# Patient Record
Sex: Female | Born: 1960 | Race: White | Hispanic: No | Marital: Married | Smoking: Never smoker
Health system: Southern US, Community
[De-identification: ages and names within clinical notes are randomized; demographics above are authoritative.]

## PROBLEM LIST (undated history)

## (undated) DIAGNOSIS — Z800 Family history of malignant neoplasm of digestive organs: Secondary | ICD-10-CM

## (undated) DIAGNOSIS — R112 Nausea with vomiting, unspecified: Secondary | ICD-10-CM

## (undated) DIAGNOSIS — J45909 Unspecified asthma, uncomplicated: Secondary | ICD-10-CM

## (undated) DIAGNOSIS — Z8041 Family history of malignant neoplasm of ovary: Secondary | ICD-10-CM

## (undated) DIAGNOSIS — Z803 Family history of malignant neoplasm of breast: Secondary | ICD-10-CM

## (undated) DIAGNOSIS — Z9889 Other specified postprocedural states: Secondary | ICD-10-CM

## (undated) DIAGNOSIS — T7840XA Allergy, unspecified, initial encounter: Secondary | ICD-10-CM

## (undated) HISTORY — DX: Unspecified asthma, uncomplicated: J45.909

## (undated) HISTORY — PX: AUGMENTATION MAMMAPLASTY: SUR837

## (undated) HISTORY — DX: Other specified postprocedural states: Z98.890

## (undated) HISTORY — PX: DILATION AND CURETTAGE OF UTERUS: SHX78

## (undated) HISTORY — DX: Family history of malignant neoplasm of breast: Z80.3

## (undated) HISTORY — DX: Family history of malignant neoplasm of digestive organs: Z80.0

## (undated) HISTORY — DX: Nausea with vomiting, unspecified: R11.2

## (undated) HISTORY — DX: Allergy, unspecified, initial encounter: T78.40XA

## (undated) HISTORY — DX: Family history of malignant neoplasm of ovary: Z80.41

---

## 1982-03-19 HISTORY — PX: TONSILECTOMY, ADENOIDECTOMY, BILATERAL MYRINGOTOMY AND TUBES: SHX2538

## 1996-03-19 HISTORY — PX: PLACEMENT OF BREAST IMPLANTS: SHX6334

## 1998-01-14 ENCOUNTER — Ambulatory Visit (HOSPITAL_COMMUNITY): Admission: RE | Admit: 1998-01-14 | Discharge: 1998-01-14 | Payer: Self-pay | Admitting: *Deleted

## 1998-07-26 ENCOUNTER — Ambulatory Visit (HOSPITAL_COMMUNITY): Admission: RE | Admit: 1998-07-26 | Discharge: 1998-07-26 | Payer: Self-pay | Admitting: *Deleted

## 1998-08-29 ENCOUNTER — Other Ambulatory Visit: Admission: RE | Admit: 1998-08-29 | Discharge: 1998-08-29 | Payer: Self-pay | Admitting: *Deleted

## 1999-03-20 HISTORY — PX: TUBAL LIGATION: SHX77

## 1999-07-07 ENCOUNTER — Inpatient Hospital Stay (HOSPITAL_COMMUNITY): Admission: AD | Admit: 1999-07-07 | Discharge: 1999-07-09 | Payer: Self-pay | Admitting: Obstetrics & Gynecology

## 1999-08-07 ENCOUNTER — Other Ambulatory Visit: Admission: RE | Admit: 1999-08-07 | Discharge: 1999-08-07 | Payer: Self-pay | Admitting: Obstetrics & Gynecology

## 1999-08-17 ENCOUNTER — Encounter: Admission: RE | Admit: 1999-08-17 | Discharge: 1999-11-15 | Payer: Self-pay | Admitting: Obstetrics & Gynecology

## 2000-08-27 ENCOUNTER — Other Ambulatory Visit: Admission: RE | Admit: 2000-08-27 | Discharge: 2000-08-27 | Payer: Self-pay | Admitting: *Deleted

## 2001-01-03 ENCOUNTER — Encounter (INDEPENDENT_AMBULATORY_CARE_PROVIDER_SITE_OTHER): Payer: Self-pay | Admitting: Specialist

## 2001-01-03 ENCOUNTER — Other Ambulatory Visit: Admission: RE | Admit: 2001-01-03 | Discharge: 2001-01-03 | Payer: Self-pay | Admitting: *Deleted

## 2002-05-19 ENCOUNTER — Other Ambulatory Visit: Admission: RE | Admit: 2002-05-19 | Discharge: 2002-05-19 | Payer: Self-pay | Admitting: *Deleted

## 2002-12-08 ENCOUNTER — Encounter: Payer: Self-pay | Admitting: *Deleted

## 2002-12-08 ENCOUNTER — Encounter: Admission: RE | Admit: 2002-12-08 | Discharge: 2002-12-08 | Payer: Self-pay | Admitting: *Deleted

## 2004-01-10 ENCOUNTER — Other Ambulatory Visit: Admission: RE | Admit: 2004-01-10 | Discharge: 2004-01-10 | Payer: Self-pay | Admitting: *Deleted

## 2004-03-10 ENCOUNTER — Ambulatory Visit (HOSPITAL_COMMUNITY): Admission: RE | Admit: 2004-03-10 | Discharge: 2004-03-10 | Payer: Self-pay | Admitting: *Deleted

## 2005-01-03 ENCOUNTER — Other Ambulatory Visit: Admission: RE | Admit: 2005-01-03 | Discharge: 2005-01-03 | Payer: Self-pay | Admitting: *Deleted

## 2005-03-16 ENCOUNTER — Ambulatory Visit (HOSPITAL_COMMUNITY): Admission: RE | Admit: 2005-03-16 | Discharge: 2005-03-16 | Payer: Self-pay | Admitting: *Deleted

## 2005-08-21 ENCOUNTER — Emergency Department (HOSPITAL_COMMUNITY): Admission: EM | Admit: 2005-08-21 | Discharge: 2005-08-21 | Payer: Self-pay | Admitting: Family Medicine

## 2006-01-02 ENCOUNTER — Other Ambulatory Visit: Admission: RE | Admit: 2006-01-02 | Discharge: 2006-01-02 | Payer: Self-pay | Admitting: *Deleted

## 2006-04-18 ENCOUNTER — Ambulatory Visit (HOSPITAL_COMMUNITY): Admission: RE | Admit: 2006-04-18 | Discharge: 2006-04-18 | Payer: Self-pay | Admitting: *Deleted

## 2007-04-22 ENCOUNTER — Ambulatory Visit (HOSPITAL_COMMUNITY): Admission: RE | Admit: 2007-04-22 | Discharge: 2007-04-22 | Payer: Self-pay | Admitting: *Deleted

## 2007-05-12 ENCOUNTER — Other Ambulatory Visit: Admission: RE | Admit: 2007-05-12 | Discharge: 2007-05-12 | Payer: Self-pay | Admitting: *Deleted

## 2007-08-27 ENCOUNTER — Ambulatory Visit (HOSPITAL_COMMUNITY): Admission: RE | Admit: 2007-08-27 | Discharge: 2007-08-27 | Payer: Self-pay | Admitting: Internal Medicine

## 2007-08-28 ENCOUNTER — Encounter (HOSPITAL_COMMUNITY): Admission: RE | Admit: 2007-08-28 | Discharge: 2007-08-29 | Payer: Self-pay | Admitting: "Endocrinology

## 2008-04-22 ENCOUNTER — Ambulatory Visit (HOSPITAL_COMMUNITY): Admission: RE | Admit: 2008-04-22 | Discharge: 2008-04-22 | Payer: Self-pay | Admitting: Gynecology

## 2009-04-29 ENCOUNTER — Ambulatory Visit (HOSPITAL_COMMUNITY): Admission: RE | Admit: 2009-04-29 | Discharge: 2009-04-29 | Payer: Self-pay | Admitting: Gynecology

## 2009-05-05 ENCOUNTER — Encounter: Admission: RE | Admit: 2009-05-05 | Discharge: 2009-05-05 | Payer: Self-pay | Admitting: Internal Medicine

## 2010-07-25 ENCOUNTER — Other Ambulatory Visit (HOSPITAL_COMMUNITY): Payer: Self-pay | Admitting: Gynecology

## 2010-07-25 DIAGNOSIS — Z1231 Encounter for screening mammogram for malignant neoplasm of breast: Secondary | ICD-10-CM

## 2010-08-09 ENCOUNTER — Ambulatory Visit (HOSPITAL_COMMUNITY)
Admission: RE | Admit: 2010-08-09 | Discharge: 2010-08-09 | Disposition: A | Discharge status: Home / Self Care | Payer: 59 | Source: Ambulatory Visit | Attending: Gynecology | Admitting: Gynecology

## 2010-08-09 DIAGNOSIS — Z1231 Encounter for screening mammogram for malignant neoplasm of breast: Secondary | ICD-10-CM | POA: Insufficient documentation

## 2011-07-27 ENCOUNTER — Other Ambulatory Visit: Payer: Self-pay | Admitting: Internal Medicine

## 2011-07-27 ENCOUNTER — Other Ambulatory Visit (HOSPITAL_COMMUNITY)
Admission: RE | Admit: 2011-07-27 | Discharge: 2011-07-27 | Disposition: A | Discharge status: Home / Self Care | Payer: 59 | Source: Ambulatory Visit | Attending: Internal Medicine | Admitting: Internal Medicine

## 2011-07-27 DIAGNOSIS — Z1159 Encounter for screening for other viral diseases: Secondary | ICD-10-CM | POA: Insufficient documentation

## 2011-07-27 DIAGNOSIS — Z01419 Encounter for gynecological examination (general) (routine) without abnormal findings: Secondary | ICD-10-CM | POA: Insufficient documentation

## 2012-01-07 ENCOUNTER — Other Ambulatory Visit (HOSPITAL_COMMUNITY): Payer: Self-pay | Admitting: Internal Medicine

## 2012-01-07 DIAGNOSIS — Z1231 Encounter for screening mammogram for malignant neoplasm of breast: Secondary | ICD-10-CM

## 2012-01-25 ENCOUNTER — Ambulatory Visit (HOSPITAL_COMMUNITY)
Admission: RE | Admit: 2012-01-25 | Discharge: 2012-01-25 | Disposition: A | Discharge status: Home / Self Care | Payer: 59 | Source: Ambulatory Visit | Attending: Internal Medicine | Admitting: Internal Medicine

## 2012-01-25 DIAGNOSIS — Z1231 Encounter for screening mammogram for malignant neoplasm of breast: Secondary | ICD-10-CM | POA: Insufficient documentation

## 2013-12-23 ENCOUNTER — Other Ambulatory Visit: Payer: Self-pay | Admitting: Gynecology

## 2014-01-18 ENCOUNTER — Other Ambulatory Visit: Payer: Self-pay | Admitting: Gynecology

## 2014-01-19 LAB — CYTOLOGY - PAP

## 2015-03-23 MED FILL — methIMAzole 5 MG TABS: 5 | 30 days supply | Qty: 60 | Fill #0 | Status: TO

## 2015-04-28 MED FILL — methIMAzole 5 MG TABS: 5 | 30 days supply | Qty: 60 | Fill #0

## 2015-06-01 MED FILL — methIMAzole 5 MG TABS: 5 | 30 days supply | Qty: 60 | Fill #1

## 2015-06-22 ENCOUNTER — Other Ambulatory Visit: Payer: Self-pay

## 2015-06-22 DIAGNOSIS — Z1231 Encounter for screening mammogram for malignant neoplasm of breast: Secondary | ICD-10-CM

## 2015-06-27 DIAGNOSIS — N941 Unspecified dyspareunia: Secondary | ICD-10-CM | POA: Diagnosis not present

## 2015-06-27 DIAGNOSIS — N94810 Vulvar vestibulitis: Secondary | ICD-10-CM | POA: Diagnosis not present

## 2015-06-27 MED FILL — BETAMETHASONE VA 0.1% CREAM: 0.1 | 30 days supply | Qty: 45 | Fill #0

## 2015-07-06 MED FILL — methIMAzole 5 MG TABS: 5 | 30 days supply | Qty: 60 | Fill #2

## 2015-07-11 ENCOUNTER — Ambulatory Visit: Payer: 59

## 2015-07-12 ENCOUNTER — Ambulatory Visit: Payer: 59

## 2015-07-22 MED FILL — DOXYCYCLINE HYCLATE 100 MG: 100 | 3 days supply | Qty: 6 | Fill #0

## 2015-07-25 DIAGNOSIS — E0590 Thyrotoxicosis, unspecified without thyrotoxic crisis or storm: Secondary | ICD-10-CM | POA: Diagnosis not present

## 2015-08-05 MED FILL — methIMAzole 5 MG TABS: 5 | 30 days supply | Qty: 60 | Fill #3

## 2015-08-08 DIAGNOSIS — H524 Presbyopia: Secondary | ICD-10-CM | POA: Diagnosis not present

## 2015-08-08 DIAGNOSIS — H5213 Myopia, bilateral: Secondary | ICD-10-CM | POA: Diagnosis not present

## 2015-08-08 DIAGNOSIS — H52221 Regular astigmatism, right eye: Secondary | ICD-10-CM | POA: Diagnosis not present

## 2015-08-25 DIAGNOSIS — Z1231 Encounter for screening mammogram for malignant neoplasm of breast: Secondary | ICD-10-CM | POA: Diagnosis not present

## 2015-08-25 DIAGNOSIS — Z6822 Body mass index (BMI) 22.0-22.9, adult: Secondary | ICD-10-CM | POA: Diagnosis not present

## 2015-08-25 DIAGNOSIS — Z01419 Encounter for gynecological examination (general) (routine) without abnormal findings: Secondary | ICD-10-CM | POA: Diagnosis not present

## 2015-08-25 MED FILL — PROGESTERONE 100 MG CAPSULE: 100 | 30 days supply | Qty: 30 | Fill #0

## 2015-09-14 MED FILL — MINIVELLE 0.05 MG PATCH: 0.05 | 28 days supply | Qty: 8 | Fill #0

## 2015-09-22 MED FILL — PROGESTERONE 100 MG CAPSULE: 100 | 30 days supply | Qty: 30 | Fill #1

## 2015-09-22 MED FILL — methIMAzole 5 MG TABS: 5 | 90 days supply | Qty: 90 | Fill #0

## 2015-10-12 MED FILL — MINIVELLE 0.05 MG PATCH: 0.05 | 28 days supply | Qty: 8 | Fill #1

## 2015-10-19 MED FILL — PROGESTERONE 100 MG CAPSULE: 100 | 30 days supply | Qty: 30 | Fill #2

## 2015-10-31 DIAGNOSIS — Z13820 Encounter for screening for osteoporosis: Secondary | ICD-10-CM | POA: Diagnosis not present

## 2015-10-31 DIAGNOSIS — Z803 Family history of malignant neoplasm of breast: Secondary | ICD-10-CM | POA: Diagnosis not present

## 2015-10-31 DIAGNOSIS — Z8041 Family history of malignant neoplasm of ovary: Secondary | ICD-10-CM | POA: Diagnosis not present

## 2015-11-04 DIAGNOSIS — E0590 Thyrotoxicosis, unspecified without thyrotoxic crisis or storm: Secondary | ICD-10-CM | POA: Diagnosis not present

## 2015-11-10 DIAGNOSIS — E0590 Thyrotoxicosis, unspecified without thyrotoxic crisis or storm: Secondary | ICD-10-CM | POA: Diagnosis not present

## 2015-11-10 DIAGNOSIS — E012 Iodine-deficiency related (endemic) goiter, unspecified: Secondary | ICD-10-CM | POA: Diagnosis not present

## 2015-11-10 MED FILL — MINIVELLE 0.05 MG PATCH: 0.05 | 28 days supply | Qty: 8 | Fill #2

## 2015-11-14 ENCOUNTER — Other Ambulatory Visit (HOSPITAL_COMMUNITY): Payer: Self-pay | Admitting: Endocrinology

## 2015-11-14 DIAGNOSIS — E049 Nontoxic goiter, unspecified: Secondary | ICD-10-CM

## 2015-11-18 ENCOUNTER — Ambulatory Visit: Payer: 59 | Attending: Gynecologic Oncology | Admitting: Gynecologic Oncology

## 2015-11-18 ENCOUNTER — Encounter: Payer: Self-pay | Admitting: Gynecologic Oncology

## 2015-11-18 ENCOUNTER — Other Ambulatory Visit (HOSPITAL_BASED_OUTPATIENT_CLINIC_OR_DEPARTMENT_OTHER): Payer: 59

## 2015-11-18 VITALS — BP 122/59 | HR 66 | Temp 97.8°F | Resp 18 | Ht 67.5 in | Wt 152.3 lb

## 2015-11-18 DIAGNOSIS — N83209 Unspecified ovarian cyst, unspecified side: Secondary | ICD-10-CM | POA: Insufficient documentation

## 2015-11-18 DIAGNOSIS — Z9851 Tubal ligation status: Secondary | ICD-10-CM | POA: Insufficient documentation

## 2015-11-18 DIAGNOSIS — Z8041 Family history of malignant neoplasm of ovary: Secondary | ICD-10-CM

## 2015-11-18 DIAGNOSIS — Z803 Family history of malignant neoplasm of breast: Secondary | ICD-10-CM | POA: Diagnosis not present

## 2015-11-18 DIAGNOSIS — Z808 Family history of malignant neoplasm of other organs or systems: Secondary | ICD-10-CM | POA: Diagnosis not present

## 2015-11-18 DIAGNOSIS — R971 Elevated cancer antigen 125 [CA 125]: Secondary | ICD-10-CM

## 2015-11-18 DIAGNOSIS — Z8249 Family history of ischemic heart disease and other diseases of the circulatory system: Secondary | ICD-10-CM | POA: Diagnosis not present

## 2015-11-18 DIAGNOSIS — E0500 Thyrotoxicosis with diffuse goiter without thyrotoxic crisis or storm: Secondary | ICD-10-CM | POA: Diagnosis not present

## 2015-11-18 DIAGNOSIS — Z833 Family history of diabetes mellitus: Secondary | ICD-10-CM | POA: Diagnosis not present

## 2015-11-18 DIAGNOSIS — N83202 Unspecified ovarian cyst, left side: Secondary | ICD-10-CM

## 2015-11-18 DIAGNOSIS — Z809 Family history of malignant neoplasm, unspecified: Secondary | ICD-10-CM | POA: Insufficient documentation

## 2015-11-18 LAB — CEA (IN HOUSE-CHCC): CEA (CHCC-In House): <1 ng/mL (ref 0.00–5.00)

## 2015-11-18 NOTE — Patient Instructions (Signed)
We will contact you with your results from the CA 125 Level and the CEA level and Dr Terrence Dupont Rossi's recommendations . We will also let you know if genetic counseling is recommended.  Thank you

## 2015-11-18 NOTE — Progress Notes (Signed)
Consult Note: Gyn-Onc  Consult was requested by Dr. Radene Knee for the evaluation of Sierra Grant 55 y.o. female  CC:  Chief Complaint  Patient presents with  . Elevated CA 125, Family history of ovarian ca    New patient    Assessment/Plan:  Sierra Grant  is a 55 y.o.  year old with a family history of ovarian, pancreatic and breast cancer in the setting of negative BRCA testing, and now with CA 125 without concerning US findings.  The patient is reluctant to undergo surgical procedures unless strongly felt to be medically necessary. She understands that in addition to the increased risk for surgical complications, a BSO also exposes women to increased all-cause mortality from surgical castration prior to the age of 55.   I explained that in her particular presentation, I do not feel strongly at this time that a BSO is strongly indicated. I explained that CA 125 is a non-specific marker.   We will start by repeating her CA 125. If it shows a consistent trend upwards or stable in the 40's, I recommend a CT abdomen and pelvis to evaluate for occult upper abdominal processes contributing to this elevation and would feel more compelled to recommend BSO or diagnostic laparoscopy. If her CA 125 has dropped, it can be assumed to have been a false positive/transient non-specific elevation, and no intervention is necessary.  In the meantime, we will have her seen by our genetic counselors as her mother's BRCA testing was performed in 2009, and her family history is striking for a familial predisposition. Therefore, more modern panels may expose an underlying genetic predisposition to breast/ovarian cancer, and may indicate proceeding with risk reducing surgery.   HPI: Sierra Grant is a 55 year old G1P1 who is seen in consult at the request of Dr Radene Knee for an elevation in [place] 125 in the setting of a strong family history for ovarian cancer. The patient is a fairly healthy woman. Her mother was  diagnosed with stage IIIc ovarian cancer at age 6 and was treated with surgery and chemotherapy ultimately succumbing to her disease 15 years after diagnosis. Her mother underwent BRCA germline testing in 2009 which was negative. However she has a very strong family history concerning for a potential hereditary cancer syndrome. The patient's maternal aunt has a history of breast cancer her maternal grandmother has a history of breast cancer her maternal grandfather has history pancreatic cancer in her maternal great uncle also has a history of pancreatic cancer and melanoma.   Premenopausally the patient's gynecologic history significant for infertility for which she received multiple laparoscopies however she did not receive ovarian stimulation. She ultimately conceive spontaneously 30 full-term pregnancy and delivery, however she did have multiple other spontaneous conception's and miscarriages. She has a history of Graves' disease.  She was followed with ultrasounds premenopausally, largely due to abnormal uterine bleeding. CA-125's are also drawn at that time due to her mother's history and these were per patient normal in the 20s. She is not had CA 125 her ultrasound for several years. She saw Dr. Radene Knee on 10/31/2015 and a CA-125 was drawn that was slightly elevated at 44 units per milliliter. A transvaginal ultrasound scan was performed that same day which revealed a grossly normal appearing uterus with dimensions of 7.7 x 3.3 x 4.8 cm with a 1.7 x 1.4 cm subserosal fibroid, a normal right ovary, and the left ovary with a simple appearing 1.6 x 1.2 cm cyst. There was no  free fluid seen.  Current Meds:  Outpatient Encounter Prescriptions as of 11/18/2015  Medication Sig  . cholecalciferol (VITAMIN D) 400 units TABS tablet Take 2,000 Units by mouth.  . loratadine (CLARITIN) 10 MG tablet Take 10 mg by mouth daily as needed for allergies.  . methimazole (TAPAZOLE) 5 MG tablet Take 5 mg by mouth daily.  Takes Mon - Fri  . MINIVELLE 0.05 MG/24HR patch   . progesterone (PROMETRIUM) 100 MG capsule 2 (two) times a week.   Marland Kitchen albuterol (PROVENTIL HFA;VENTOLIN HFA) 108 (90 Base) MCG/ACT inhaler Inhale 1-2 puffs into the lungs every 6 (six) hours as needed for wheezing or shortness of breath.   No facility-administered encounter medications on file as of 11/18/2015.     Allergy: Not on File  Social Hx:   Social History   Social History  . Marital status: Married    Spouse name: N/A  . Number of children: N/A  . Years of education: N/A   Occupational History  . Not on file.   Social History Main Topics  . Smoking status: Never Smoker  . Smokeless tobacco: Never Used  . Alcohol use No  . Drug use: No  . Sexual activity: Not on file   Other Topics Concern  . Not on file   Social History Narrative  . No narrative on file    Past Surgical Hx:  Past Surgical History:  Procedure Laterality Date  . [place] , 2002  . PLACEMENT OF BREAST IMPLANTS  1998  . TONSILECTOMY, ADENOIDECTOMY, BILATERAL MYRINGOTOMY AND TUBES  1984  . TUBAL LIGATION  2001    Past Medical Hx:  Past Medical History:  Diagnosis Date  . Allergy     Past Gynecological History:  infertility No LMP recorded.  Family Hx:  Family History  Problem Relation Age of Onset  . Cancer Mother   . Hypertension Mother   . Cancer Father   . Diabetes Father   . Hypertension Father   . Cancer Maternal Grandmother   . Cancer Maternal Grandfather   . Diabetes Maternal Grandfather   . Diabetes Paternal Grandmother   . Diabetes Paternal Grandfather     Review of Systems:  Constitutional  Feels well,    ENT Normal appearing ears and nares bilaterally Skin/Breast  No rash, sores, jaundice, itching, dryness Cardiovascular  No chest pain, shortness of breath, or edema  Pulmonary  No cough or wheeze.  Gastro Intestinal  No nausea, vomitting, or diarrhoea. No bright red blood per  rectum, no abdominal pain, change in bowel movement, or constipation.  Genito Urinary  No frequency, urgency, dysuria, no abnormal paps Musculo Skeletal  No myalgia, arthralgia, joint swelling or pain  Neurologic  No weakness, numbness, change in gait,  Psychology  No depression, anxiety, insomnia.   Vitals:  Blood pressure (!) 122/59, pulse 66, temperature 97.8 F (36.6 C), temperature source Oral, resp. rate 18, height 5' 7.5" (1.715 m), weight 152 lb 4.8 oz (69.1 kg), SpO2 100 %.  Physical Exam: Deferred  45 minutes of face to face counseling was performed with the patient.   Donaciano Eva, MD  11/18/2015, 5:42 PM

## 2015-11-19 LAB — CA 125: Cancer Antigen (CA) 125: 45 U/mL — ABNORMAL HIGH (ref 0.0–38.1)

## 2015-11-23 ENCOUNTER — Telehealth: Payer: Self-pay

## 2015-11-23 MED FILL — PROGESTERONE 100 MG CAPSULE: 100 | 90 days supply | Qty: 90 | Fill #3

## 2015-11-23 NOTE — Telephone Encounter (Signed)
Orders received from [facility] to contact the patient to update with CEA Level is "normal" and her CA 125 level is slightly elevated. Dr Terrence Dupont Rossi's recommendations are to proceed with CT sacn of abdomen and pelvis with oral and IV contrast. Also the genetic counselors reviewed her mothers testing and it recommended to proceed with testing since there are updated panels. Also patient needs to be scheduled for the CT Scan. Attempted to contact the patient , no answer , left a detailed message with call back requested . Out contact information was given. Melissa Cross, APNP aware.

## 2015-11-24 ENCOUNTER — Other Ambulatory Visit: Payer: Self-pay | Admitting: Gynecologic Oncology

## 2015-11-24 DIAGNOSIS — R971 Elevated cancer antigen 125 [CA 125]: Secondary | ICD-10-CM

## 2015-11-24 NOTE — Telephone Encounter (Signed)
Patient returned call , patient to schedule Ct due to her job hours and responsibilities .Patient not sure if she wants the genetic testing as of yet , will call back after see contacts her insurance. Melissa Cross , APNP aware.

## 2015-11-29 DIAGNOSIS — E0590 Thyrotoxicosis, unspecified without thyrotoxic crisis or storm: Secondary | ICD-10-CM | POA: Diagnosis not present

## 2015-12-09 MED FILL — MINIVELLE 0.05 MG PATCH: 0.05 | 28 days supply | Qty: 8 | Fill #3

## 2015-12-16 ENCOUNTER — Ambulatory Visit: Payer: 59 | Admitting: Gynecology

## 2015-12-22 ENCOUNTER — Encounter (HOSPITAL_COMMUNITY): Payer: Self-pay | Admitting: Radiology

## 2015-12-22 ENCOUNTER — Ambulatory Visit (HOSPITAL_COMMUNITY)
Admission: RE | Admit: 2015-12-22 | Discharge: 2015-12-22 | Disposition: A | Discharge status: Home / Self Care | Payer: 59 | Source: Ambulatory Visit | Attending: Gynecologic Oncology | Admitting: Gynecologic Oncology

## 2015-12-22 DIAGNOSIS — Z803 Family history of malignant neoplasm of breast: Secondary | ICD-10-CM | POA: Diagnosis not present

## 2015-12-22 DIAGNOSIS — Z8041 Family history of malignant neoplasm of ovary: Secondary | ICD-10-CM | POA: Insufficient documentation

## 2015-12-22 DIAGNOSIS — Z808 Family history of malignant neoplasm of other organs or systems: Secondary | ICD-10-CM | POA: Insufficient documentation

## 2015-12-22 DIAGNOSIS — R971 Elevated cancer antigen 125 [CA 125]: Secondary | ICD-10-CM | POA: Diagnosis not present

## 2015-12-22 MED ORDER — IOPAMIDOL (ISOVUE-300) INJECTION 61%
100.0000 mL | Freq: Once | INTRAVENOUS | Status: AC | PRN
  Administered 2015-12-22: 100 mL via INTRAVENOUS

## 2015-12-23 ENCOUNTER — Telehealth: Payer: Self-pay

## 2015-12-23 NOTE — Telephone Encounter (Signed)
Orders received from [facility] to contact the patient with her Ct of Abdomen and Pelvis : "no abnormal findings on CT scan, Ovaries are unremarkable". Dr Everitt Amber is recommending Genetic testing ,if genetics show risk ,then surgery is recommended". Attempted to contact the patient , no answer , left a detailed massage with call back requested to ensure the message was received as well as understood. Our office number was provided.

## 2016-01-06 ENCOUNTER — Telehealth: Payer: Self-pay

## 2016-01-06 NOTE — Telephone Encounter (Signed)
Orders received from [facility] to contact the patient to follow up on if the patient would like to follow through with Genetic Testing. The patient was contacted and "declined" Genetic Testing ,due to no medical coverage ,unless it is "medically necessary". Melissa Cross, APNP updated with the patient's decline in genetic testing.

## 2016-01-10 DIAGNOSIS — E0590 Thyrotoxicosis, unspecified without thyrotoxic crisis or storm: Secondary | ICD-10-CM | POA: Diagnosis not present

## 2016-01-10 DIAGNOSIS — E559 Vitamin D deficiency, unspecified: Secondary | ICD-10-CM | POA: Diagnosis not present

## 2016-01-10 DIAGNOSIS — J309 Allergic rhinitis, unspecified: Secondary | ICD-10-CM | POA: Diagnosis not present

## 2016-01-10 DIAGNOSIS — R0989 Other specified symptoms and signs involving the circulatory and respiratory systems: Secondary | ICD-10-CM | POA: Diagnosis not present

## 2016-01-10 DIAGNOSIS — Z0000 Encounter for general adult medical examination without abnormal findings: Secondary | ICD-10-CM | POA: Diagnosis not present

## 2016-01-10 DIAGNOSIS — R002 Palpitations: Secondary | ICD-10-CM | POA: Diagnosis not present

## 2016-01-10 DIAGNOSIS — Z1211 Encounter for screening for malignant neoplasm of colon: Secondary | ICD-10-CM | POA: Diagnosis not present

## 2016-01-10 DIAGNOSIS — Z1231 Encounter for screening mammogram for malignant neoplasm of breast: Secondary | ICD-10-CM | POA: Diagnosis not present

## 2016-01-10 MED FILL — MINIVELLE 0.05 MG PATCH: 0.05 | 28 days supply | Qty: 8 | Fill #4

## 2016-01-11 MED FILL — methIMAzole 5 MG TABS: 5 | 90 days supply | Qty: 90 | Fill #0

## 2016-01-30 DIAGNOSIS — Z0000 Encounter for general adult medical examination without abnormal findings: Secondary | ICD-10-CM | POA: Diagnosis not present

## 2016-02-01 ENCOUNTER — Ambulatory Visit (HOSPITAL_COMMUNITY): Payer: 59

## 2016-02-06 ENCOUNTER — Ambulatory Visit (HOSPITAL_COMMUNITY)
Admission: RE | Admit: 2016-02-06 | Discharge: 2016-02-06 | Disposition: A | Discharge status: Home / Self Care | Payer: 59 | Source: Ambulatory Visit | Attending: Endocrinology | Admitting: Endocrinology

## 2016-02-06 DIAGNOSIS — E049 Nontoxic goiter, unspecified: Secondary | ICD-10-CM

## 2016-02-15 MED FILL — MINIVELLE 0.05 MG PATCH: 0.05 | 28 days supply | Qty: 8 | Fill #5

## 2016-02-15 MED FILL — PROGESTERONE 100 MG CAPSULE: 100 | 90 days supply | Qty: 90 | Fill #4

## 2016-04-10 MED FILL — ESTRADIOL 0.05 MG PATCH: 0.05 | 84 days supply | Qty: 24 | Fill #0

## 2016-04-23 MED FILL — methIMAzole 5 MG TABS: 5 | 90 days supply | Qty: 90 | Fill #1

## 2016-04-25 MED FILL — OSELTAMIVIR PHOS 75 MG CAP: 75 | 10 days supply | Qty: 10 | Fill #0

## 2016-05-14 DIAGNOSIS — E0590 Thyrotoxicosis, unspecified without thyrotoxic crisis or storm: Secondary | ICD-10-CM | POA: Diagnosis not present

## 2016-05-24 MED FILL — PROGESTERONE 100 MG CAPSULE: 100 | 90 days supply | Qty: 90 | Fill #5

## 2016-06-29 MED FILL — ESTRADIOL 0.05 MG PATCH: 0.05 | 84 days supply | Qty: 24 | Fill #1

## 2016-07-09 MED FILL — methIMAzole 5 MG TABS: 5 | 30 days supply | Qty: 30 | Fill #2

## 2016-07-25 MED FILL — methIMAzole 10 MG TABS: 10 | 30 days supply | Qty: 30 | Fill #0

## 2016-08-23 MED FILL — PROGESTERONE 100 MG CAPSULE: 100 | 90 days supply | Qty: 90 | Fill #0

## 2016-08-23 MED FILL — methIMAzole 10 MG TABS: 10 | 30 days supply | Qty: 30 | Fill #1

## 2016-10-11 MED FILL — ESTRADIOL 0.05 MG PATCH: 0.05 | 84 days supply | Qty: 24 | Fill #2

## 2016-10-17 MED FILL — methIMAzole 10 MG TABS: 10 | 30 days supply | Qty: 30 | Fill #2

## 2016-11-06 DIAGNOSIS — E0590 Thyrotoxicosis, unspecified without thyrotoxic crisis or storm: Secondary | ICD-10-CM | POA: Diagnosis not present

## 2016-11-13 DIAGNOSIS — E0590 Thyrotoxicosis, unspecified without thyrotoxic crisis or storm: Secondary | ICD-10-CM | POA: Diagnosis not present

## 2016-11-13 DIAGNOSIS — E012 Iodine-deficiency related (endemic) goiter, unspecified: Secondary | ICD-10-CM | POA: Diagnosis not present

## 2016-11-22 MED FILL — methIMAzole 10 MG TABS: 10 | 90 days supply | Qty: 78 | Fill #0

## 2016-11-29 MED FILL — PROGESTERONE 100 MG CAPSULE: 100 | 90 days supply | Qty: 90 | Fill #0

## 2017-01-16 DIAGNOSIS — Z803 Family history of malignant neoplasm of breast: Secondary | ICD-10-CM | POA: Diagnosis not present

## 2017-01-16 DIAGNOSIS — Z6822 Body mass index (BMI) 22.0-22.9, adult: Secondary | ICD-10-CM | POA: Diagnosis not present

## 2017-01-16 DIAGNOSIS — Z8041 Family history of malignant neoplasm of ovary: Secondary | ICD-10-CM | POA: Diagnosis not present

## 2017-01-16 DIAGNOSIS — Z1231 Encounter for screening mammogram for malignant neoplasm of breast: Secondary | ICD-10-CM | POA: Diagnosis not present

## 2017-01-16 DIAGNOSIS — Z808 Family history of malignant neoplasm of other organs or systems: Secondary | ICD-10-CM | POA: Diagnosis not present

## 2017-01-16 DIAGNOSIS — Z01419 Encounter for gynecological examination (general) (routine) without abnormal findings: Secondary | ICD-10-CM | POA: Diagnosis not present

## 2017-01-17 ENCOUNTER — Encounter: Payer: Self-pay | Admitting: Gastroenterology

## 2017-01-30 MED FILL — ESTRADIOL 0.05 MG PATCH: 0.05 | 84 days supply | Qty: 24 | Fill #0

## 2017-02-05 DIAGNOSIS — R971 Elevated cancer antigen 125 [CA 125]: Secondary | ICD-10-CM | POA: Diagnosis not present

## 2017-02-05 DIAGNOSIS — Z803 Family history of malignant neoplasm of breast: Secondary | ICD-10-CM | POA: Diagnosis not present

## 2017-02-05 DIAGNOSIS — Z8041 Family history of malignant neoplasm of ovary: Secondary | ICD-10-CM | POA: Diagnosis not present

## 2017-02-06 ENCOUNTER — Other Ambulatory Visit (HOSPITAL_COMMUNITY): Payer: Self-pay | Admitting: Obstetrics and Gynecology

## 2017-02-06 DIAGNOSIS — Z803 Family history of malignant neoplasm of breast: Secondary | ICD-10-CM

## 2017-02-12 DIAGNOSIS — H52221 Regular astigmatism, right eye: Secondary | ICD-10-CM | POA: Diagnosis not present

## 2017-02-12 DIAGNOSIS — H524 Presbyopia: Secondary | ICD-10-CM | POA: Diagnosis not present

## 2017-02-12 DIAGNOSIS — H5213 Myopia, bilateral: Secondary | ICD-10-CM | POA: Diagnosis not present

## 2017-02-25 ENCOUNTER — Ambulatory Visit (HOSPITAL_COMMUNITY): Admission: RE | Admit: 2017-02-25 | Payer: 59 | Source: Ambulatory Visit

## 2017-03-01 MED FILL — PROGESTERONE 100 MG CAPSULE: 100 | 90 days supply | Qty: 90 | Fill #0

## 2017-03-04 ENCOUNTER — Ambulatory Visit (HOSPITAL_COMMUNITY)
Admission: RE | Admit: 2017-03-04 | Discharge: 2017-03-04 | Disposition: A | Discharge status: Home / Self Care | Payer: 59 | Source: Ambulatory Visit | Attending: Obstetrics and Gynecology | Admitting: Obstetrics and Gynecology

## 2017-03-04 DIAGNOSIS — Z803 Family history of malignant neoplasm of breast: Secondary | ICD-10-CM | POA: Insufficient documentation

## 2017-03-04 DIAGNOSIS — N6489 Other specified disorders of breast: Secondary | ICD-10-CM | POA: Diagnosis not present

## 2017-03-04 MED ORDER — GADOBENATE DIMEGLUMINE 529 MG/ML IV SOLN
15.0000 mL | Freq: Once | INTRAVENOUS | Status: AC | PRN
  Administered 2017-03-04: 15 mL via INTRAVENOUS

## 2017-03-05 MED FILL — methIMAzole 10 MG TABS: 10 | 90 days supply | Qty: 78 | Fill #1

## 2017-03-07 ENCOUNTER — Ambulatory Visit (AMBULATORY_SURGERY_CENTER): Payer: Self-pay

## 2017-03-07 VITALS — Ht 69.0 in | Wt 156.8 lb

## 2017-03-07 DIAGNOSIS — Z1211 Encounter for screening for malignant neoplasm of colon: Secondary | ICD-10-CM

## 2017-03-07 MED ORDER — NA SULFATE-K SULFATE-MG SULF 17.5-3.13-1.6 GM/177ML PO SOLN: 1.0000 | Freq: Once | ORAL | 0 refills | Status: AC

## 2017-03-07 MED FILL — SUPREP BOWEL PREP KIT: 17.5-3.13-1 | 1 days supply | Qty: 354 | Fill #0

## 2017-03-07 NOTE — Progress Notes (Signed)
Per pt, no allergies to soy or egg products.Pt not taking any weight loss meds or using  O2 at home.   Emmi video sent to email.

## 2017-03-21 ENCOUNTER — Ambulatory Visit (AMBULATORY_SURGERY_CENTER): Payer: 59 | Admitting: Gastroenterology

## 2017-03-21 ENCOUNTER — Encounter: Payer: Self-pay | Admitting: Gastroenterology

## 2017-03-21 ENCOUNTER — Telehealth: Payer: Self-pay

## 2017-03-21 ENCOUNTER — Other Ambulatory Visit: Payer: Self-pay

## 2017-03-21 VITALS — BP 107/66 | HR 75 | Temp 98.4°F | Resp 16 | Ht 69.0 in | Wt 156.0 lb

## 2017-03-21 DIAGNOSIS — Z800 Family history of malignant neoplasm of digestive organs: Secondary | ICD-10-CM | POA: Diagnosis present

## 2017-03-21 DIAGNOSIS — Z1211 Encounter for screening for malignant neoplasm of colon: Secondary | ICD-10-CM | POA: Diagnosis not present

## 2017-03-21 DIAGNOSIS — Z1212 Encounter for screening for malignant neoplasm of rectum: Secondary | ICD-10-CM | POA: Diagnosis not present

## 2017-03-21 MED ORDER — SODIUM CHLORIDE 0.9 % IV SOLN: 500.0000 mL | INTRAVENOUS | Status: DC

## 2017-03-21 NOTE — Telephone Encounter (Signed)
-----  Message from Ladene Artist, MD sent at 03/21/2017  2:44 PM EST ----- Please call pt. She had colonoscopy today and asked me to review her family history of cancer in more detail especially related to pancreatic cancer. She is a [employer] loss adjuster. I spoke with her and her husband for a while following her colonoscopy today.    Family history in [place] shows 2 cousins with colon cancer which I was not aware of earlier today. Colonoscopy was scheduled as routine screening, not [facility]. I have just changed her colonoscopy report to reflect this history, indication and changed her recall colonoscopy to 5 years instead of 10 as I told her earlier today.  Family history in [place] shows MGF, M uncle and P aunt with pancreatic cancer. From my reading on this topic today screening for pancreatic cancer should be considered for people who are surgical candidates with 2 or more blood relatives with pancreatic cancer with at least 1 of those being a first degree relative. In addition certain genetic markers such as BRCA2, PALB2, Lynch syndrome. Please have her confirm the results of her genetic testing. She told me it was negative so she does not current fit the guideline however if her family history hanges please notify us.

## 2017-03-21 NOTE — Patient Instructions (Signed)
**  Handouts given on hemorrhoids**   YOU HAD AN ENDOSCOPIC PROCEDURE TODAY AT [facility]:   Refer to the procedure report that was given to you for any specific questions about what was found during the examination.  If the procedure report does not answer your questions, please call your gastroenterologist to clarify.  If you requested that your care partner not be given the details of your procedure findings, then the procedure report has been included in a sealed envelope for you to review at your convenience later.  YOU SHOULD EXPECT: Some feelings of bloating in the abdomen. Passage of more gas than usual.  Walking can help get rid of the air that was put into your GI tract during the procedure and reduce the bloating. If you had a lower endoscopy (such as a colonoscopy or flexible sigmoidoscopy) you may notice spotting of blood in your stool or on the toilet paper. If you underwent a bowel prep for your procedure, you may not have a normal bowel movement for a few days.  Please Note:  You might notice some irritation and congestion in your nose or some drainage.  This is from the oxygen used during your procedure.  There is no need for concern and it should clear up in a day or so.  SYMPTOMS TO REPORT IMMEDIATELY:   Following lower endoscopy (colonoscopy or flexible sigmoidoscopy):  Excessive amounts of blood in the stool  Significant tenderness or worsening of abdominal pains  Swelling of the abdomen that is new, acute  Fever of 100F or higher   For urgent or emergent issues, a gastroenterologist can be reached at any hour by calling [phone].   DIET:  We do recommend a small meal at first, but then you may proceed to your regular diet.  Drink plenty of fluids but you should avoid alcoholic beverages for 24 hours.  ACTIVITY:  You should plan to take it easy for the rest of today and you should NOT DRIVE or use heavy machinery until tomorrow (because of the  sedation medicines used during the test).    FOLLOW UP: Our staff will call the number listed on your records the next business day following your procedure to check on you and address any questions or concerns that you may have regarding the information given to you following your procedure. If we do not reach you, we will leave a message.  However, if you are feeling well and you are not experiencing any problems, there is no need to return our call.  We will assume that you have returned to your regular daily activities without incident.  If any biopsies were taken you will be contacted by phone or by letter within the next 1-3 weeks.  Please call us at [phone] if you have not heard about the biopsies in 3 weeks.    SIGNATURES/CONFIDENTIALITY: You and/or your care partner have signed paperwork which will be entered into your electronic medical record.  These signatures attest to the fact that that the information above on your After Visit Summary has been reviewed and is understood.  Full responsibility of the confidentiality of this discharge information lies with you and/or your care-partner.

## 2017-03-21 NOTE — Telephone Encounter (Signed)
All of the information relayed to the patient.  All questions answered.  She will call back for any additional questions or concerns

## 2017-03-21 NOTE — Progress Notes (Signed)
Report to PACU, RN, vss, BBS= Clear.  

## 2017-03-21 NOTE — Progress Notes (Signed)
Pt's states no medical or surgical changes since previsit or office visit. 

## 2017-03-21 NOTE — Op Note (Addendum)
[facility] Patient Name: Sierra Grant Procedure Date: 03/21/2017 10:44 AM MRN: [id number] Endoscopist: Ladene Artist , MD Age: 57 Referring MD:  Date of Birth: 11-Sep-1960 Gender: Female Account #: [id number] Procedure:                Colonoscopy Indications:              Colon cancer screening in patient at increased                            risk: Family history of colorectal cancer in                            multiple 2nd degree relatives Medicines:                Monitored Anesthesia Care Procedure:                Pre-Anesthesia Assessment:                           - Prior to the procedure, a History and Physical                            was performed, and patient medications and                            allergies were reviewed. The patient's tolerance of                            previous anesthesia was also reviewed. The risks                            and benefits of the procedure and the sedation                            options and risks were discussed with the patient.                            All questions were answered, and informed consent                            was obtained. Prior Anticoagulants: The patient has                            taken no previous anticoagulant or antiplatelet                            agents. ASA Grade Assessment: I - A normal, healthy                            patient. After reviewing the risks and benefits,                            the patient was deemed in satisfactory condition to  undergo the procedure.                           After obtaining informed consent, the colonoscope                            was passed under direct vision. Throughout the                            procedure, the patient's blood pressure, pulse, and                            oxygen saturations were monitored continuously. The                            Model PCF-[id number]) scope was introduced                             through the anus and advanced to the the cecum,                            identified by appendiceal orifice and ileocecal                            valve. The ileocecal valve, appendiceal orifice,                            and rectum were photographed. The quality of the                            bowel preparation was excellent. The colonoscopy                            was performed without difficulty. The patient                            tolerated the procedure well. Scope In: 11:00:04 AM Scope Out: 11:16:52 AM Scope Withdrawal Time: 0 hours 12 minutes 28 seconds  Total Procedure Duration: 0 hours 16 minutes 48 seconds  Findings:                 The perianal and digital rectal examinations were                            normal.                           Internal hemorrhoids were found during                            retroflexion. The hemorrhoids were small and Grade                            I (internal hemorrhoids that do not prolapse).  The exam was otherwise without abnormality on                            direct and retroflexion views. Complications:            No immediate complications. Estimated blood loss:                            None. Estimated Blood Loss:     Estimated blood loss: none. Impression:               - Internal hemorrhoids.                           - The examination was otherwise normal on direct                            and retroflexion views.                           - No specimens collected. Recommendation:           - Repeat colonoscopy in 5 years for higher risk                            screening purposes.                           - Patient has a contact number available for                            emergencies. The signs and symptoms of potential                            delayed complications were discussed with the                            patient. Return to normal activities  tomorrow.                            Written discharge instructions were provided to the                            patient.                           - Resume previous diet.                           - Continue present medications. Ladene Artist, MD 03/21/2017 11:19:42 AM This report has been signed electronically.

## 2017-03-22 ENCOUNTER — Telehealth: Payer: Self-pay

## 2017-03-22 NOTE — Telephone Encounter (Signed)
  Follow up Call-  Call back number 03/21/2017  Post procedure Call Back phone  # [phone]  Permission to leave phone message Yes  Some recent data might be hidden     Patient questions:  Do you have a fever, pain , or abdominal swelling? No. Pain Score  0 *  Have you tolerated food without any problems? Yes.    Have you been able to return to your normal activities? Yes.    Do you have any questions about your discharge instructions: Diet   No. Medications  No. Follow up visit  No.  Do you have questions or concerns about your Care? No.  Actions: * If pain score is 4 or above: No action needed, pain <4.  No problems noted per pt.  She added two comments.  First, she stated, "you guys did an exceptional job".  And second, she said she received a survey in the mail after her pre-visit and she said she works in the [employer] and she felt that the survey should have come after the procedure.  That it could be confusing for someone that is not in the medical field.  I thanked her for her comments and would pass this on to our management. maw

## 2017-05-14 MED FILL — ESTRADIOL 0.05 MG PATCH: 0.05 | 84 days supply | Qty: 24 | Fill #0

## 2017-05-15 DIAGNOSIS — E0590 Thyrotoxicosis, unspecified without thyrotoxic crisis or storm: Secondary | ICD-10-CM | POA: Diagnosis not present

## 2017-06-04 MED FILL — PROGESTERONE 100 MG CAPSULE: 100 | 90 days supply | Qty: 90 | Fill #1 | Status: TO

## 2017-06-18 MED FILL — methIMAzole 10 MG TABS: 10 | 90 days supply | Qty: 78 | Fill #2

## 2017-07-29 MED FILL — ESTRADIOL 0.05 MG PATCH: 0.05 | 84 days supply | Qty: 24 | Fill #1

## 2017-09-20 MED FILL — methIMAzole 10 MG TABS: 10 | 90 days supply | Qty: 78 | Fill #3

## 2017-11-05 DIAGNOSIS — E049 Nontoxic goiter, unspecified: Secondary | ICD-10-CM | POA: Diagnosis not present

## 2017-11-05 DIAGNOSIS — E0590 Thyrotoxicosis, unspecified without thyrotoxic crisis or storm: Secondary | ICD-10-CM | POA: Diagnosis not present

## 2017-11-06 MED FILL — ESTRADIOL 0.05 MG PATCH: 0.05 | 84 days supply | Qty: 24 | Fill #2

## 2017-11-12 DIAGNOSIS — E012 Iodine-deficiency related (endemic) goiter, unspecified: Secondary | ICD-10-CM | POA: Diagnosis not present

## 2017-11-12 DIAGNOSIS — E0590 Thyrotoxicosis, unspecified without thyrotoxic crisis or storm: Secondary | ICD-10-CM | POA: Diagnosis not present

## 2017-12-05 MED FILL — PROGESTERONE 100 MG CAPSULE: 100 | 90 days supply | Qty: 90 | Fill #0

## 2017-12-27 MED FILL — methIMAzole 10 MG TABS: 10 | 90 days supply | Qty: 78 | Fill #0

## 2018-02-04 MED FILL — ESTRADIOL 0.05 MG PATCH: 0.05 | 84 days supply | Qty: 24 | Fill #0

## 2018-02-17 DIAGNOSIS — Z6824 Body mass index (BMI) 24.0-24.9, adult: Secondary | ICD-10-CM | POA: Diagnosis not present

## 2018-02-17 DIAGNOSIS — Z01419 Encounter for gynecological examination (general) (routine) without abnormal findings: Secondary | ICD-10-CM | POA: Diagnosis not present

## 2018-02-17 DIAGNOSIS — Z1231 Encounter for screening mammogram for malignant neoplasm of breast: Secondary | ICD-10-CM | POA: Diagnosis not present

## 2018-03-11 MED FILL — PROGESTERONE 100 MG CAPSULE: 100 | 90 days supply | Qty: 90 | Fill #0

## 2018-03-23 IMAGING — CT CT ABD-PELV W/ CM
2 of 5 series · 17 of 46 positions shown, 19 images · IV contrast (iopamidol)
Comparison: None.

CLINICAL DATA: Patient with elevated CA 125.

EXAM:
CT ABDOMEN AND PELVIS WITH CONTRAST
TECHNIQUE: Multidetector CT imaging of the abdomen and pelvis was performed
using the standard protocol following bolus administration of
intravenous contrast.
CONTRAST:  100mL [id number] IOPAMIDOL ([id number]-SGG) INJECTION 61%

[Series 2: rtn a/p with · axial · 0.72mm/px · z∈[-492,-132]mm · 14 of 82 slices shown, 16 images]
[im 5/82  soft-tissue]
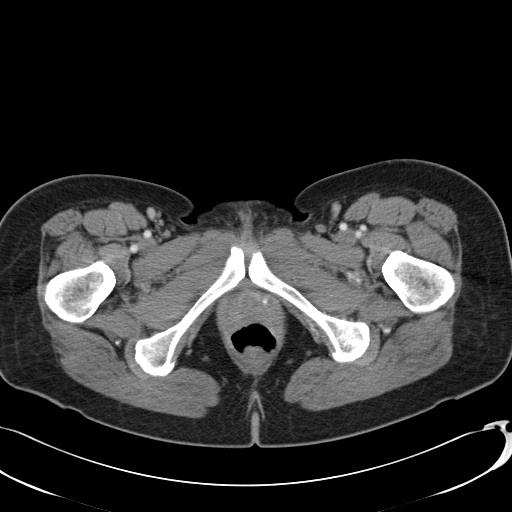
[im 5/82  bone]
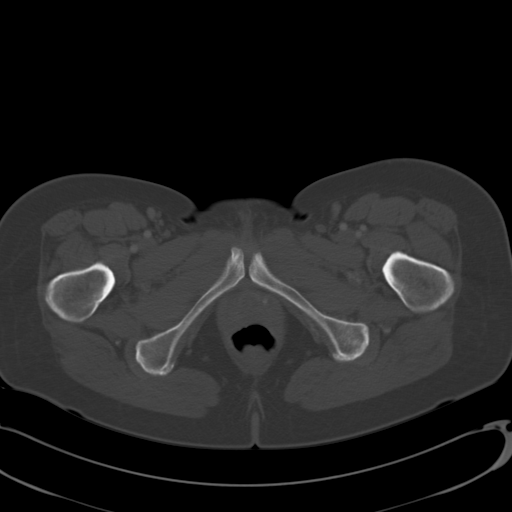
[im 10/82  soft-tissue]
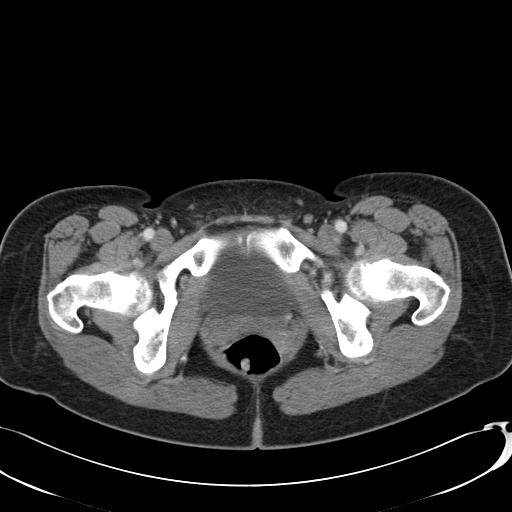
[im 15/82  soft-tissue]
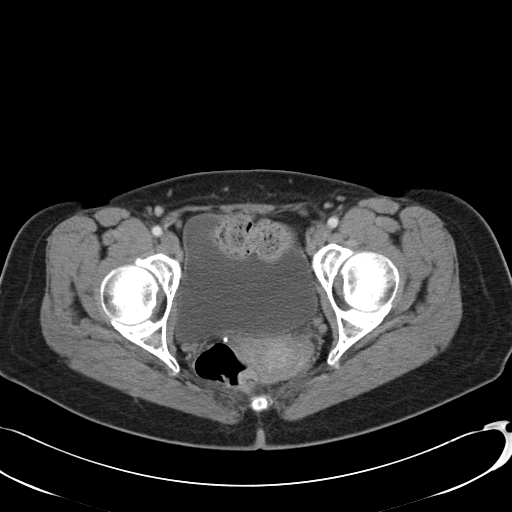
[im 24/82  soft-tissue]
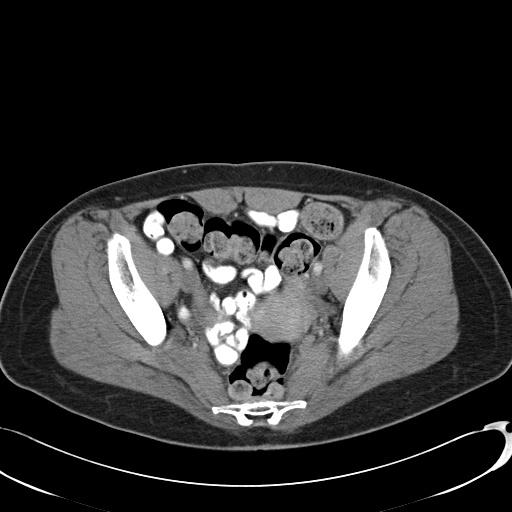
[im 29/82  soft-tissue]
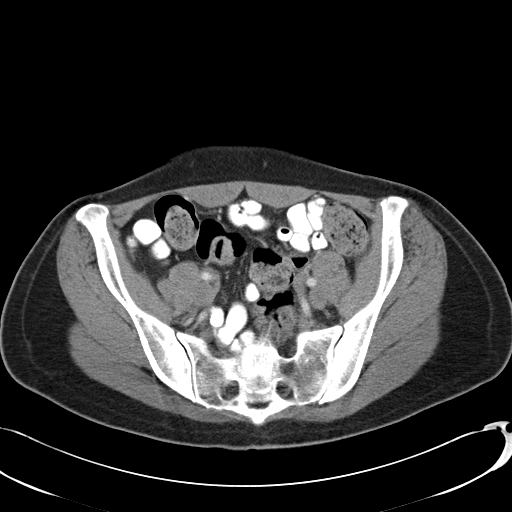
[im 34/82  soft-tissue]
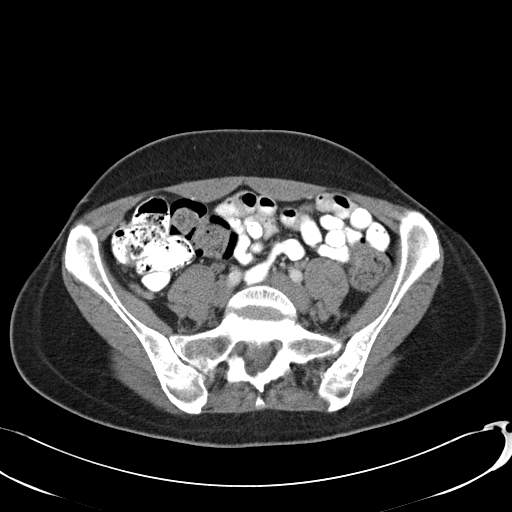
[im 39/82  soft-tissue]
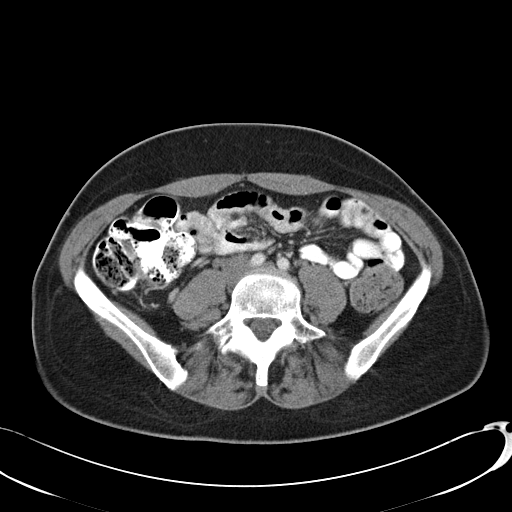
[im 43/82  soft-tissue]
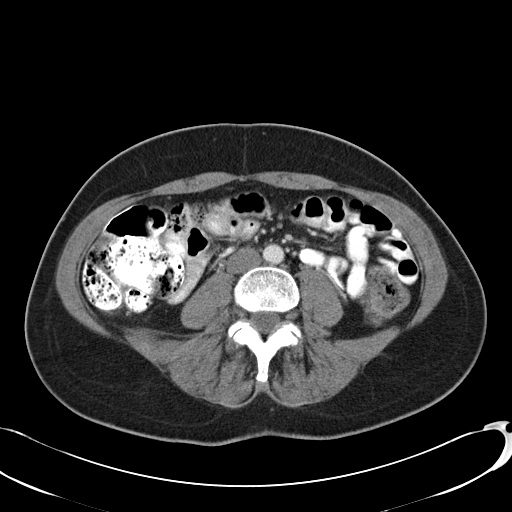
[im 48/82  soft-tissue]
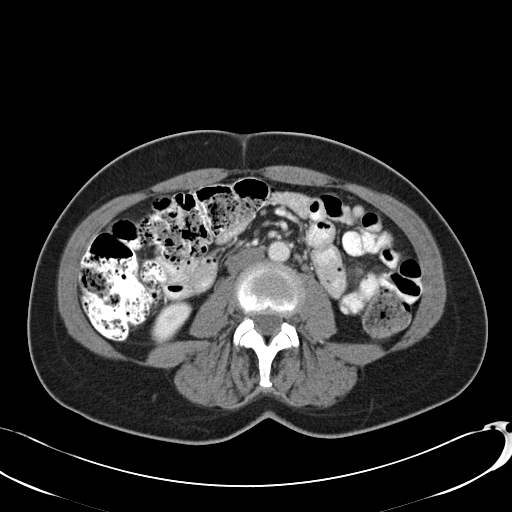
[im 48/82  bone]
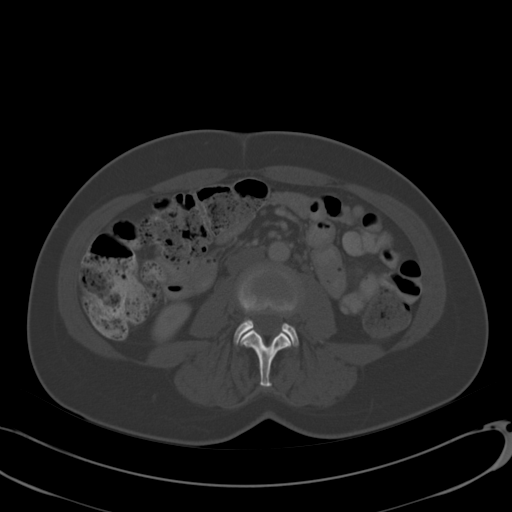
[im 53/82  soft-tissue]
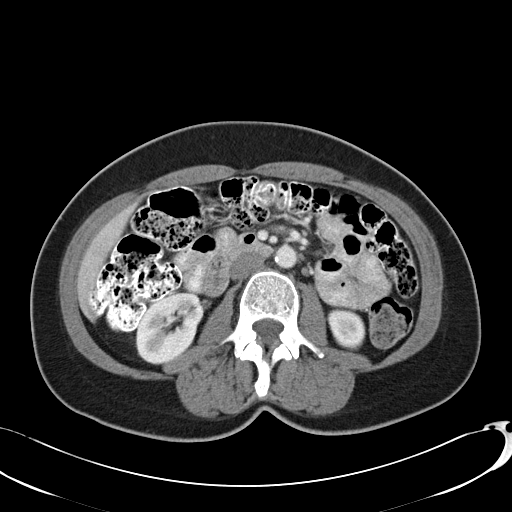
[im 62/82  soft-tissue]
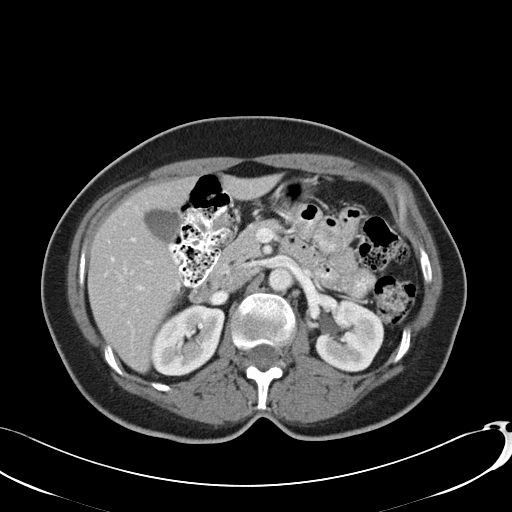
[im 67/82  soft-tissue]
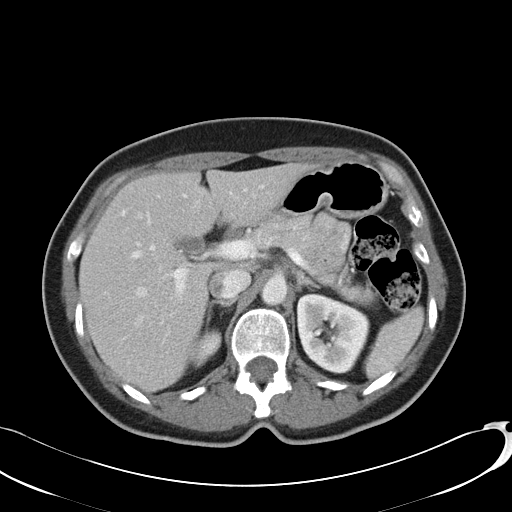
[im 72/82  soft-tissue]
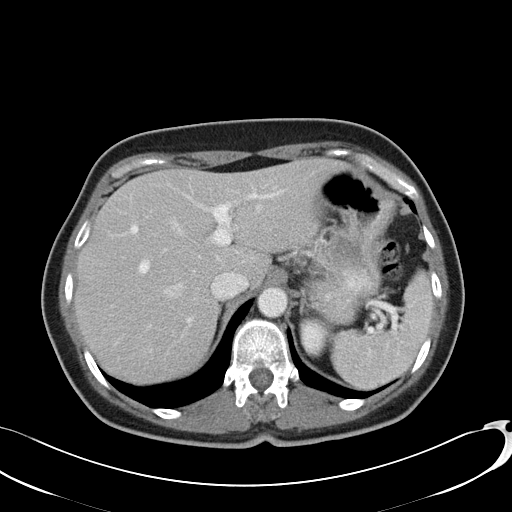
[im 77/82  soft-tissue]
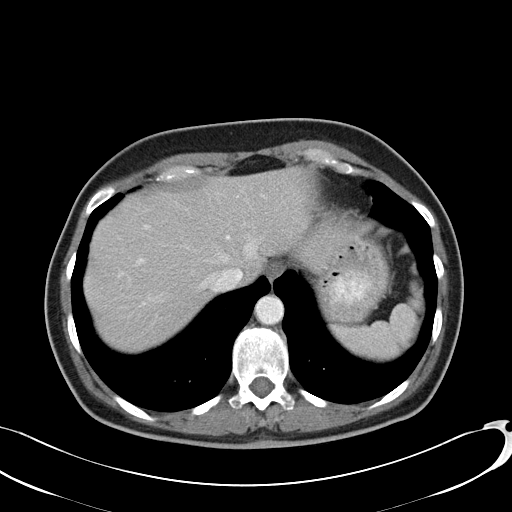

[Series 602: <mpr thick range> · coronal · 0.80mm/px · 3 of 116 slices shown]
[im 39/116  soft-tissue]
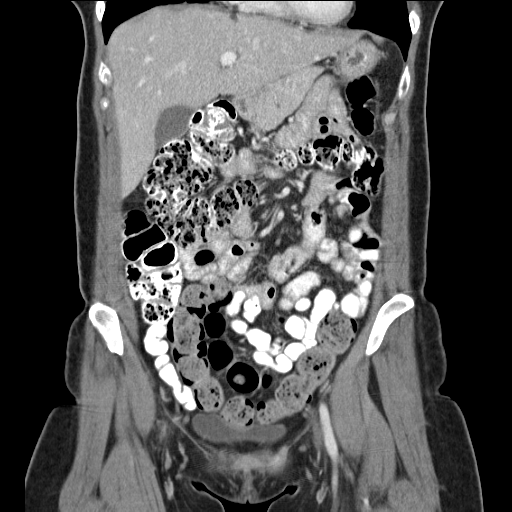
[im 52/116  soft-tissue]
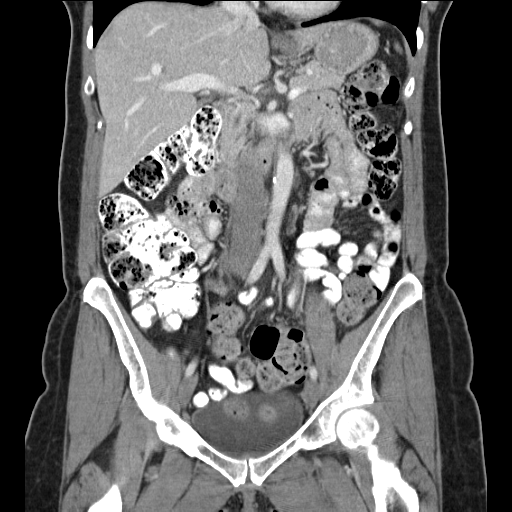
[im 64/116  soft-tissue]
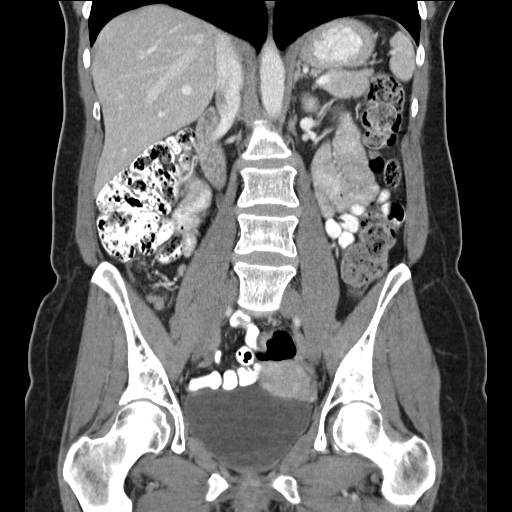

[17 of 46 positions shown; findings below may reference images not displayed]

FINDINGS: Lower chest: Normal heart size. Minimal atelectasis within the lower
lobes bilaterally. No pleural effusion.

Hepatobiliary: Liver is normal in size and contour. Superiorly
within the left hepatic lobe is a 12 mm cyst (image 4; series 2).
Within the central liver (image 8; series 2) there is a too small to
characterize low-attenuation lesion, favored to represent a small
cyst. Additional too small to characterize low-attenuation lesion
right hepatic lobe (image 23; series 2). Gallbladder is
unremarkable.

Pancreas: Unremarkable

Spleen: Unremarkable

Adrenals/Urinary Tract: Adrenal glands are normal. Kidneys enhance
symmetrically with contrast. Urinary bladder is unremarkable.

Stomach/Bowel: No abnormal bowel wall thickening or evidence for
bowel obstruction. No free fluid or free intraperitoneal air. The
appendix is normal. Normal morphology of the stomach.

Vascular/Lymphatic: Normal caliber abdominal aorta. Peripheral
calcified atherosclerotic plaque. No retroperitoneal
lymphadenopathy.

Reproductive: Probable 2 cm fibroid within the left aspect of the
uterine fundus. Adnexal structures are unremarkable.

Other: None.

Musculoskeletal: Lumbar spine degenerative changes. No aggressive or
acute appearing osseous lesions.
IMPRESSION: No acute process within the abdomen or pelvis.

Grossly unremarkable adnexal structures.

## 2018-04-25 MED FILL — DOTTI 0.05 MG/24HR PTTW: 0.05 | 84 days supply | Qty: 24 | Fill #1 | Status: TO

## 2018-04-25 MED FILL — methIMAzole 10 MG TABS: 10 | 90 days supply | Qty: 78 | Fill #1 | Status: TO

## 2018-05-13 DIAGNOSIS — E0590 Thyrotoxicosis, unspecified without thyrotoxic crisis or storm: Secondary | ICD-10-CM | POA: Diagnosis not present

## 2018-05-25 DIAGNOSIS — N390 Urinary tract infection, site not specified: Secondary | ICD-10-CM | POA: Diagnosis not present

## 2018-06-18 DIAGNOSIS — R971 Elevated cancer antigen 125 [CA 125]: Secondary | ICD-10-CM | POA: Diagnosis not present

## 2018-06-18 DIAGNOSIS — Z842 Family history of other diseases of the genitourinary system: Secondary | ICD-10-CM | POA: Diagnosis not present

## 2018-06-18 DIAGNOSIS — Z803 Family history of malignant neoplasm of breast: Secondary | ICD-10-CM | POA: Diagnosis not present

## 2018-06-18 DIAGNOSIS — Z8041 Family history of malignant neoplasm of ovary: Secondary | ICD-10-CM | POA: Diagnosis not present

## 2018-07-15 MED FILL — methIMAzole 10 MG TABS: 10 | 90 days supply | Qty: 78 | Fill #0

## 2018-07-15 MED FILL — DOTTI 0.05 MG/24HR PTTW: 0.05 | 84 days supply | Qty: 24 | Fill #0

## 2018-09-17 MED FILL — PROGESTERONE 100 MG CAPSULE: 100 | 90 days supply | Qty: 90 | Fill #0

## 2018-09-22 MED FILL — ESTRADIOL 0.05 MG/24HR PTTW: 0.05 | 84 days supply | Qty: 24 | Fill #0

## 2018-10-22 MED FILL — methIMAzole 10 MG TABS: 10 | 90 days supply | Qty: 78 | Fill #1

## 2018-11-04 DIAGNOSIS — E0590 Thyrotoxicosis, unspecified without thyrotoxic crisis or storm: Secondary | ICD-10-CM | POA: Diagnosis not present

## 2018-11-11 DIAGNOSIS — E0590 Thyrotoxicosis, unspecified without thyrotoxic crisis or storm: Secondary | ICD-10-CM | POA: Diagnosis not present

## 2018-11-11 DIAGNOSIS — E012 Iodine-deficiency related (endemic) goiter, unspecified: Secondary | ICD-10-CM | POA: Diagnosis not present

## 2018-12-17 MED FILL — PROGESTERONE 100 MG CAPSULE: 100 | 90 days supply | Qty: 90 | Fill #0

## 2018-12-17 MED FILL — ESTRADIOL 0.05 MG/24HR PTTW: 0.05 | 84 days supply | Qty: 24 | Fill #1

## 2019-01-20 MED FILL — methIMAzole 10 MG TABS: 10 | 90 days supply | Qty: 90 | Fill #0

## 2019-02-20 DIAGNOSIS — Z1231 Encounter for screening mammogram for malignant neoplasm of breast: Secondary | ICD-10-CM | POA: Diagnosis not present

## 2019-03-03 DIAGNOSIS — Z6822 Body mass index (BMI) 22.0-22.9, adult: Secondary | ICD-10-CM | POA: Diagnosis not present

## 2019-03-03 DIAGNOSIS — Z01419 Encounter for gynecological examination (general) (routine) without abnormal findings: Secondary | ICD-10-CM | POA: Diagnosis not present

## 2019-03-03 MED FILL — ESTRADIOL 0.05 MG/24HR PTTW: 0.05 | 84 days supply | Qty: 24 | Fill #0

## 2019-03-18 ENCOUNTER — Encounter: Payer: Self-pay | Admitting: Gastroenterology

## 2019-03-18 DIAGNOSIS — R971 Elevated cancer antigen 125 [CA 125]: Secondary | ICD-10-CM | POA: Diagnosis not present

## 2019-03-18 DIAGNOSIS — Z8041 Family history of malignant neoplasm of ovary: Secondary | ICD-10-CM | POA: Diagnosis not present

## 2019-03-28 MED FILL — PROGESTERONE 100 MG CAPSULE: 100 | 90 days supply | Qty: 90 | Fill #1

## 2019-04-13 MED FILL — methIMAzole 10 MG TABS: 10 | 90 days supply | Qty: 90 | Fill #1

## 2019-04-16 ENCOUNTER — Other Ambulatory Visit: Payer: 59

## 2019-04-21 ENCOUNTER — Ambulatory Visit: Payer: 59 | Admitting: Gastroenterology

## 2019-05-12 DIAGNOSIS — E0590 Thyrotoxicosis, unspecified without thyrotoxic crisis or storm: Secondary | ICD-10-CM | POA: Diagnosis not present

## 2019-06-09 ENCOUNTER — Encounter: Payer: Self-pay | Admitting: Gastroenterology

## 2019-06-09 ENCOUNTER — Ambulatory Visit (INDEPENDENT_AMBULATORY_CARE_PROVIDER_SITE_OTHER): Payer: 59 | Admitting: Gastroenterology

## 2019-06-09 VITALS — BP 110/70 | HR 64 | Temp 98.0°F | Ht 69.0 in | Wt 143.8 lb

## 2019-06-09 DIAGNOSIS — Z800 Family history of malignant neoplasm of digestive organs: Secondary | ICD-10-CM | POA: Diagnosis not present

## 2019-06-09 NOTE — Progress Notes (Signed)
    History of Present Illness: This is a 59 year old female with a strong family history of pancreatic cancer who is interested in determining if she is a candidate for pancreatic cancer screening.  She relates a maternal great great aunt, a maternal great uncle, maternal aunt and a maternal grandfather all with pancreatic cancer.  Her mother had ovarian cancer.  She has 2 cousins who developed colon cancer.  She relates her mother underwent genetic testing through [facility] with negative results.  Patient states she had genetic testing performed through Dr. Ophelia Charter office which was also negative. Colonoscopy performed in January 2019 for multiple second-degree relatives with colon cancer showed only internal hemorrhoids.  Exam was complete to cecum with an excellent prep. Denies weight loss, abdominal pain, constipation, diarrhea, change in stool caliber, melena, hematochezia, nausea, vomiting, dysphagia, reflux symptoms, chest pain.   Current Medications, Allergies, Past Medical History, Past Surgical History, Family History and Social History were reviewed in [employer] record.   Physical Exam: General: Well developed, well nourished, no acute distress Head: Normocephalic and atraumatic Eyes:  sclerae anicteric, EOMI Ears: Normal auditory acuity Mouth: Not examined, mask on during Covid-19 pandemic Lungs: Clear throughout to auscultation Heart: Regular rate and rhythm; no murmurs, rubs or bruits Abdomen: Soft, non tender and non distended. No masses, hepatosplenomegaly or hernias noted. Normal Bowel sounds Rectal: Not done Musculoskeletal: Symmetrical with no gross deformities  Pulses:  Normal pulses noted Extremities: No clubbing, cyanosis, edema or deformities noted Neurological: Alert oriented x 4, grossly nonfocal Psychological:  Alert and cooperative. Normal mood and affect   Assessment and Recommendations:  1.  Strong family history of pancreatic  cancer without first-degree relatives.  Negative genetic testing through Dr. Ophelia Charter office after discussion recommended genetic counseling and consideration of further testing.  Await genetic counseling recommendations.  Consider abdominal MRI for pancreatic cancer screening based on genetic counseling recommendations.  2.  Family history of colon cancer, 2 second-degree relatives.  5-year interval colonoscopy is recommended in January 2024 unless information from genetic testing and counseling indicates otherwise.

## 2019-06-09 NOTE — Patient Instructions (Signed)
 cancer center will contact you with an appointment date and time to see a Dietitian.   Thank you for choosing me and [facility].  Pricilla Riffle. Dagoberto Ligas., MD., [facility] Regal

## 2019-06-17 ENCOUNTER — Telehealth: Payer: Self-pay | Admitting: Licensed Clinical Social Worker

## 2019-06-17 NOTE — Telephone Encounter (Signed)
Sierra Grant has been cld and scheduled for a virtual visit to see [name] on 4/12 at 1pm.

## 2019-06-17 NOTE — Telephone Encounter (Signed)
Received a genetic counseling referral from [facility] for fhx of pancreatic cancer. Ms.

## 2019-06-22 MED FILL — PROGESTERONE MICRONIZED 100: 100 | 90 days supply | Qty: 90 | Fill #0

## 2019-06-22 MED FILL — ESTRADIOL 0.05 MG/24HR PTTW: 0.05 | 84 days supply | Qty: 24 | Fill #1

## 2019-06-29 ENCOUNTER — Ambulatory Visit (HOSPITAL_BASED_OUTPATIENT_CLINIC_OR_DEPARTMENT_OTHER): Payer: 59 | Admitting: Licensed Clinical Social Worker

## 2019-06-29 ENCOUNTER — Encounter: Payer: Self-pay | Admitting: Licensed Clinical Social Worker

## 2019-06-29 DIAGNOSIS — Z803 Family history of malignant neoplasm of breast: Secondary | ICD-10-CM | POA: Insufficient documentation

## 2019-06-29 DIAGNOSIS — Z8041 Family history of malignant neoplasm of ovary: Secondary | ICD-10-CM

## 2019-06-29 DIAGNOSIS — Z800 Family history of malignant neoplasm of digestive organs: Secondary | ICD-10-CM | POA: Insufficient documentation

## 2019-06-29 NOTE — Progress Notes (Signed)
REFERRING PROVIDER: Ladene Artist, MD [address],   [address]  PRIMARY PROVIDER:  Leeroy Cha, MD  PRIMARY REASON FOR VISIT:  1. Family history of pancreatic cancer   2. Family history of breast cancer   3. Family history of ovarian cancer   4. Family history of colon cancer     I connected with Ms. Garris on 06/29/2019 at 1:00 PM EDT by MyChart video conference and verified that I am speaking with the correct person using two identifiers.    Patient location: work Provider location: [place]  HISTORY OF PRESENT ILLNESS:   Ms. Berroa, a 59 y.o. female, was seen for a [facility] cancer genetics consultation at the request of Dr. Fuller Plan due to a family history of cancer.  Ms. Rainford presents to clinic today to discuss the possibility of a hereditary predisposition to cancer, genetic testing, and to further clarify her future cancer risks, as well as potential cancer risks for family members.   Ms. Kriegel is a 59 y.o. female with no personal history of cancer.  She had [facility] genetic testing that was negative in 2018 through Dr. Radene Knee. This also showed a 20% lifetime risk for breast cancer which she is followed for. She has seen Dr. Fuller Plan recently to discuss screening for pancreatic cancer.   CANCER HISTORY:  Oncology History   No history exists.     Past Medical History:  Diagnosis Date  . Allergy   . Family history of breast cancer   . Family history of colon cancer   . Family history of ovarian cancer   . Family history of pancreatic cancer   . Post-operative nausea and vomiting    Pt states she has "shaking"after sedation!  . Reactive airway disease    triggers due to perfumes, smoke and animals    Past Surgical History:  Procedure Laterality Date  . [place] , 2002  . PLACEMENT OF BREAST IMPLANTS  1998  . TONSILECTOMY, ADENOIDECTOMY, BILATERAL MYRINGOTOMY AND TUBES  1984   Per pt, no tubes put  in!  . TUBAL LIGATION  2001    Social History   Socioeconomic History  . Marital status: Married    Spouse name: Not on file  . Number of children: Not on file  . Years of education: Not on file  . Highest education level: Not on file  Occupational History  . Not on file  Tobacco Use  . Smoking status: Never Smoker  . Smokeless tobacco: Never Used  Substance and Sexual Activity  . Alcohol use: No  . Drug use: No  . Sexual activity: Not on file  Other Topics Concern  . Not on file  Social History Narrative  . Not on file   Social Determinants of Health   Financial Resource Strain:   . Difficulty of Paying Living Expenses:   Food Insecurity:   . Worried About Charity fundraiser in the Last Year:   . Arboriculturist in the Last Year:   Transportation Needs:   . Film/video editor (Medical):   Marland Kitchen Lack of Transportation (Non-Medical):   Physical Activity:   . Days of Exercise per Week:   . Minutes of Exercise per Session:   Stress:   . Feeling of Stress :   Social Connections:   . Frequency of Communication with Friends and Family:   . Frequency of Social Gatherings with Friends and Family:   .  Attends Religious Services:   . Active Member of Clubs or Organizations:   . Attends Archivist Meetings:   Marland Kitchen Marital Status:      FAMILY HISTORY:  We obtained a detailed, 4-generation family history.  Significant diagnoses are listed below: Family History  Problem Relation Age of Onset  . Cancer Mother 38       peritoneal  . Hypertension Mother   . Cancer Father 63       melanoma  . Diabetes Father   . Hypertension Father   . Breast cancer Maternal Grandmother 67  . Diabetes Maternal Grandfather   . Pancreatic cancer Maternal Grandfather 43  . Diabetes Paternal Grandmother   . Diabetes Paternal Grandfather   . Alzheimer's disease Paternal Grandfather   . Hyperlipidemia Brother   . Pancreatic cancer Paternal Aunt 23  . Colon cancer Cousin 81  .  Colon cancer Cousin 70  . Pancreatic cancer Other        mat great uncle and mat great great aunt  . Breast cancer Maternal Aunt   . Alzheimer's disease Paternal Uncle   . Alzheimer's disease Paternal Aunt    Ms. Rideout has 1 son, 21, no cancer history. Patient has 1 brother, 3, no cancers.   Ms. Worthington's mother was diagnosed with peritoneal cancer at 53 and died in her 66s. She had genetic testing in 2009 that was negative. Patient has 2 maternal uncles and 1 maternal aunt. Her aunt had breast cancer at 19 and is living. Maternal grandmother had breast cancer at 65 and died at 60. Maternal grandfather had pancreatic cancer at 31 and died at 42. His brother,  Patient's great uncle, had pancreatic cancer. Their aunt also had pancreatic cancer.   Ms. Yohannes's father was [place] with [place] at 41[place]. This was excised and came back 5 years later and he passed at 74. Patient had 8 paternal uncles and 4 paternal aunts. Five uncles and 3 aunts passed of Alzheimer's, all diagnosed at older ages. One aunt was diagnosed with pancreatic acner at 6 and is living at 28, and she has not had genetic testing. Patient also has 2 paternal cousins who had colon cancer. One was diagnosed at 19, she is unsure if he had genetic testing. The other cousin was diagnosed in his 88s.  Paternal grandfather had Alzheimer's and died at 75, grandmother died in her early 22s.  Ms. Gose is aware of previous family history of genetic testing for hereditary cancer risks. There is no reported Ashkenazi Jewish ancestry. There is no known consanguinity.  GENETIC COUNSELING ASSESSMENT: Ms. Artist is a 59 y.o. female with a family history of pancreatic cancer which is somewhat suggestive of a hereditary cancer syndrome and predisposition to cancer. We, therefore, discussed and recommended the following at today's visit.   DISCUSSION: We discussed that 5 - 10% of pancreatic cancer is  hereditary, with most cases associated with BRCA1/BRCA2 mutations, which she has tested negative for previously.  There are other genes that can be associated with hereditary pancreatic, breast, ovarian and colon cancer syndromes.  These include We discussed that testing is beneficial for several reasons including knowing how to follow individuals for cancer screenings, and understand if other family members could be at risk for cancer and allow them to undergo genetic testing.   We reviewed the characteristics, features and inheritance patterns of hereditary cancer syndromes. We also discussed genetic testing, including the appropriate  family members to test, the process of testing, insurance coverage and turn-around-time for results. We discussed the implications of a negative, positive and/or variant of uncertain significant result. We recommended Ms. Jobin pursue genetic testing for the Multi-Cancer gene panel.   The Multi-Cancer Panel offered by [facility] includes sequencing and/or deletion duplication testing of the following 85 genes: AIP, ALK, APC, ATM, AXIN2,BAP1,  BARD1, BLM, BMPR1A, BRCA1, BRCA2, BRIP1, CASR, CDC73, CDH1, CDK4, CDKN1B, CDKN1C, CDKN2A (p14ARF), CDKN2A (p16INK4a), CEBPA, CHEK2, CTNNA1, DICER1, DIS3L2, EGFR (c.2369C>T, p.Thr790Met variant only), EPCAM (Deletion/duplication testing only), FH, FLCN, GATA2, GPC3, GREM1 (Promoter region deletion/duplication testing only), HOXB13 (c.251G>A, p.Gly84Glu), HRAS, KIT, MAX, MEN1, MET, MITF (c.952G>A, p.Glu318Lys variant only), MLH1, MSH2, MSH3, MSH6, MUTYH, NBN, NF1, NF2, NTHL1, PALB2, PDGFRA, PHOX2B, PMS2, POLD1, POLE, POT1, PRKAR1A, PTCH1, PTEN, RAD50, RAD51C, RAD51D, RB1, RECQL4, RET, RNF43, RUNX1, SDHAF2, SDHA (sequence changes only), SDHB, SDHC, SDHD, SMAD4, SMARCA4, SMARCB1, SMARCE1, STK11, SUFU, TERC, TERT, TMEM127, TP53, TSC1, TSC2, VHL, WRN and WT1.   Based on Ms. Tramble's family history of cancer, she meets medical criteria for genetic  testing. Despite that she meets criteria, she may still have an out of pocket cost. We discussed that if her out of pocket cost for testing is over $100, the laboratory will call and confirm whether she wants to proceed with testing.  If the out of pocket cost of testing is less than $100 she will be billed by the genetic testing laboratory.   We discussed that some people do not want to undergo genetic testing due to fear of genetic discrimination.  A federal law called the Genetic Information Non-Discrimination Act (GINA) of 2008 helps protect individuals against genetic discrimination based on their genetic test results.  It impacts both health insurance and employment.  For health insurance, it protects against increased premiums, being kicked off insurance or being forced to take a test in order to be insured.  For employment it protects against hiring, firing and promoting decisions based on genetic test results.  Health status due to a cancer diagnosis is not protected under GINA.  This law does not protect life insurance, disability insurance, or other types of insurance.   PLAN: After considering the risks, benefits, and limitations, Ms. Hlavac provided informed consent to pursue genetic testing. A saliva kit was mailed to her and the sample will be sent to  [facility] for analysis of the Multi-Cancer Panel. Results should be available within approximately 2-3 weeks' time, at which point they will be disclosed by telephone to Ms. Sarate, as will any additional recommendations warranted by these results. Ms. Rauth will receive a summary of her genetic counseling visit and a copy of her results once available. This information will also be available in Epic.   Based on Ms. Galentine's family history, we recommended her paternal aunt and cousins have genetic counseling and testing. Ms. Caprio will let us know if we can be of any assistance in coordinating genetic counseling and/or testing for  this family member.   Lastly, we encouraged Ms. Hagner to remain in contact with cancer genetics annually so that we can continuously update the family history and inform her of any changes in cancer genetics and testing that may be of benefit for this family.   Ms. Collingsworth's questions were answered to her satisfaction today. Our contact information was provided should additional questions or concerns arise. Thank you for the referral and allowing [place] to share in the care of your patient.   Faith Rogue,  MS, [facility] Genetic Counselor [email or web address] Phone: [phone]  The patient was seen for a total of 50 minutes in face-to-face genetic counseling.  Drs. Magrinat, Lindi Adie and/or Burr Medico were available for discussion regarding this case.   _______________________________________________________________________ For Office Staff:  Number of people involved in session: 1 Was an Intern/ student involved with case: no

## 2019-07-14 DIAGNOSIS — Z8041 Family history of malignant neoplasm of ovary: Secondary | ICD-10-CM | POA: Diagnosis not present

## 2019-07-14 DIAGNOSIS — Z803 Family history of malignant neoplasm of breast: Secondary | ICD-10-CM | POA: Diagnosis not present

## 2019-07-22 ENCOUNTER — Telehealth: Payer: Self-pay | Admitting: Licensed Clinical Social Worker

## 2019-07-27 ENCOUNTER — Ambulatory Visit: Payer: Self-pay | Admitting: Licensed Clinical Social Worker

## 2019-07-27 ENCOUNTER — Encounter: Payer: Self-pay | Admitting: Licensed Clinical Social Worker

## 2019-07-27 DIAGNOSIS — Z1379 Encounter for other screening for genetic and chromosomal anomalies: Secondary | ICD-10-CM | POA: Insufficient documentation

## 2019-07-27 DIAGNOSIS — Z803 Family history of malignant neoplasm of breast: Secondary | ICD-10-CM

## 2019-07-27 DIAGNOSIS — Z8041 Family history of malignant neoplasm of ovary: Secondary | ICD-10-CM

## 2019-07-27 DIAGNOSIS — Z800 Family history of malignant neoplasm of digestive organs: Secondary | ICD-10-CM

## 2019-07-27 NOTE — Telephone Encounter (Signed)
Revealed negative genetic testing.  We discussed that we do not know why there is cancer in the family. It could be due to a different gene that we are not testing, or something our current technology cannot pick up.  It will be important for her to keep in contact with genetics to learn if additional testing may be needed in the future.

## 2019-07-27 NOTE — Progress Notes (Signed)
HPI:  Sierra Grant was previously seen in the [facility] clinic due to a family history of cancer and concerns regarding a hereditary predisposition to cancer. Please refer to our prior cancer genetics clinic note for more information regarding our discussion, assessment and recommendations, at the time. Sierra Grant's recent genetic test results were disclosed to her, as were recommendations warranted by these results. These results and recommendations are discussed in more detail below.  CANCER HISTORY:  Oncology History   No history exists.    FAMILY HISTORY:  We obtained a detailed, 4-generation family history.  Significant diagnoses are listed below: Family History  Problem Relation Age of Onset  . Cancer Mother 26       peritoneal  . Hypertension Mother   . Cancer Father 34       melanoma  . Diabetes Father   . Hypertension Father   . Breast cancer Maternal Grandmother 67  . Diabetes Maternal Grandfather   . Pancreatic cancer Maternal Grandfather 36  . Diabetes Paternal Grandmother   . Diabetes Paternal Grandfather   . Alzheimer's disease Paternal Grandfather   . Hyperlipidemia Brother   . Pancreatic cancer Paternal Aunt 29  . Colon cancer Cousin 85  . Colon cancer Cousin 38  . Pancreatic cancer Other        mat great uncle and mat great great aunt  . Breast cancer Maternal Aunt   . Alzheimer's disease Paternal Uncle   . Alzheimer's disease Paternal Aunt    Sierra Grant has 1 son, 51, no cancer history. Patient has 1 brother, 86, no cancers.   Sierra Grant's mother was diagnosed with peritoneal cancer at 26 and died in her 74s. She had genetic testing in 2009 that was negative. Patient has 2 maternal uncles and 1 maternal aunt. Her aunt had breast cancer at 76 and is living. Maternal grandmother had breast cancer at 103 and died at 68. Maternal grandfather had pancreatic cancer at 67 and died at 70. His brother,  Patient's great uncle, had pancreatic cancer. Their  aunt also had pancreatic cancer.   Sierra Grant's father was [place] at 19[place]. This was excised and came back 5 years later and he passed at 22. Patient had 8 paternal uncles and 4 paternal aunts. Five uncles and 3 aunts passed of Alzheimer's, all diagnosed at older ages. One aunt was diagnosed with pancreatic acner at 38 and is living at 61, and she has not had genetic testing. Patient also has 2 paternal cousins who had colon cancer. One was diagnosed at 27, she is unsure if he had genetic testing. The other cousin was diagnosed in his 57s.  Paternal grandfather had Alzheimer's and died at 51, grandmother died in her early 77s.  Sierra Grant is aware of previous family history of genetic testing for hereditary cancer risks. There is no reported Ashkenazi Jewish ancestry. There is no known consanguinity   GENETIC TEST RESULTS: Genetic testing reported out on 07/21/2019 through the [facility] Multi- cancer panel found no pathogenic mutations.   The Multi-Cancer Panel offered by [facility] includes sequencing and/or deletion duplication testing of the following 85 genes: AIP, ALK, APC, ATM, AXIN2,BAP1,  BARD1, BLM, BMPR1A, BRCA1, BRCA2, BRIP1, CASR, CDC73, CDH1, CDK4, CDKN1B, CDKN1C, CDKN2A (p14ARF), CDKN2A (p16INK4a), CEBPA, CHEK2, CTNNA1, DICER1, DIS3L2, EGFR (c.2369C>T, p.Thr790Met variant only), EPCAM (Deletion/duplication testing only), FH, FLCN, GATA2, GPC3, GREM1 (Promoter region deletion/duplication testing only), HOXB13 (c.251G>A, p.Gly84Glu),  HRAS, KIT, MAX, MEN1, MET, MITF (c.952G>A, p.Glu318Lys variant only), MLH1, MSH2, MSH3, MSH6, MUTYH, NBN, NF1, NF2, NTHL1, PALB2, PDGFRA, PHOX2B, PMS2, POLD1, POLE, POT1, PRKAR1A, PTCH1, PTEN, RAD50, RAD51C, RAD51D, RB1, RECQL4, RET, RNF43, RUNX1, SDHAF2, SDHA (sequence changes only), SDHB, SDHC, SDHD, SMAD4, SMARCA4, SMARCB1, SMARCE1, STK11, SUFU, TERC, TERT, TMEM127, TP53, TSC1, TSC2, VHL, WRN and WT1.   The  test report has been scanned into EPIC and is located under the Molecular Pathology section of the Results Review tab.  A portion of the result report is included below for reference.     We discussed with Sierra Grant that because current genetic testing is not perfect, it is possible there may be a gene mutation in one of these genes that current testing cannot detect, but that chance is small.  We also discussed, that there could be another gene that has not yet been discovered, or that we have not yet tested, that is responsible for the cancer diagnoses in the family. It is also possible there is a hereditary cause for the cancer in the family that Sierra Grant did not inherit and therefore was not identified in her testing.  Therefore, it is important to remain in touch with cancer genetics in the future so that we can continue to offer Sierra Grant the most up to date genetic testing.   ADDITIONAL GENETIC TESTING: We discussed with Sierra Grant that her genetic testing was fairly extensive.  If there are genes identified to increase cancer risk that can be analyzed in the future, we would be happy to discuss and coordinate this testing at that time.    CANCER SCREENING RECOMMENDATIONS: Sierra Grant's test result is considered negative (normal).  This means that we have not identified a hereditary cause for her family history of cancer at this time.   While reassuring, this does not definitively rule out a hereditary predisposition to cancer. It is still possible that there could be genetic mutations that are undetectable by current technology. There could be genetic mutations in genes that have not been tested or identified to increase cancer risk.  Therefore, it is recommended she continue to follow the cancer management and screening guidelines provided by her primary healthcare provider. Sierra Grant also reports that Dr. Radene Knee performs risk models for her each year to determine what type of breast screening  to do for her.   An individual's cancer risk and medical management are not determined by genetic test results alone. Overall cancer risk assessment incorporates additional factors, including personal medical history, family history, and any available genetic information that may result in a personalized plan for cancer prevention and surveillance.  RECOMMENDATIONS FOR FAMILY MEMBERS:  Relatives in this family might be at some increased risk of developing cancer, over the general population risk, simply due to the family history of cancer.  We recommended female relatives in this family have a yearly mammogram beginning at age 60, or 53 years younger than the earliest onset of cancer, an annual clinical breast exam, and perform monthly breast self-exams. Female relatives in this family should also have a gynecological exam as recommended by their primary provider. All family members should have a colonoscopy by age 20-50, or as directed by their physicians.  It is also possible there is a hereditary cause for the cancer in Sierra Grant's family that she did not inherit and therefore was not identified in her.  Based on Sierra Grant's family history, we recommended her maternal and paternal relatives, especially  those who have had cancer, have genetic counseling and testing. Maternal relatives may qualify for pancreatic cancer screening.  Sierra Grant will let us know if we can be of any assistance in coordinating genetic counseling and/or testing for these family members.  FOLLOW-UP: Lastly, we discussed with Sierra Grant that cancer genetics is a rapidly advancing field and it is possible that new genetic tests will be appropriate for her and/or her family members in the future. We encouraged her to remain in contact with cancer genetics on an annual basis so we can update her personal and family histories and let her know of advances in cancer genetics that may benefit this family.   Our contact number was  provided. Ms. Vanderweide's questions were answered to her satisfaction, and she knows she is welcome to call us at anytime with additional questions or concerns.   Faith Rogue, MS, [facility] Genetic Counselor [email or web address] Phone: [phone]

## 2019-08-10 MED FILL — methIMAzole 10 MG TABS: 10 | 90 days supply | Qty: 90 | Fill #2

## 2019-09-17 MED FILL — PROGESTERONE 100 MG CAPS: 100 | 90 days supply | Qty: 90 | Fill #1

## 2019-09-17 MED FILL — ESTRADIOL 0.05 MG/24HR PTTW: 0.05 | 84 days supply | Qty: 24 | Fill #2

## 2019-11-03 DIAGNOSIS — E0590 Thyrotoxicosis, unspecified without thyrotoxic crisis or storm: Secondary | ICD-10-CM | POA: Diagnosis not present

## 2019-11-10 DIAGNOSIS — E012 Iodine-deficiency related (endemic) goiter, unspecified: Secondary | ICD-10-CM | POA: Diagnosis not present

## 2019-11-10 DIAGNOSIS — E0590 Thyrotoxicosis, unspecified without thyrotoxic crisis or storm: Secondary | ICD-10-CM | POA: Diagnosis not present

## 2019-11-10 DIAGNOSIS — E559 Vitamin D deficiency, unspecified: Secondary | ICD-10-CM | POA: Diagnosis not present

## 2019-11-10 DIAGNOSIS — R718 Other abnormality of red blood cells: Secondary | ICD-10-CM | POA: Diagnosis not present

## 2019-11-18 MED FILL — methIMAzole 10 MG TABS: 10 | 90 days supply | Qty: 90 | Fill #3

## 2019-12-11 MED FILL — PROGESTERONE 100 MG CAPS: 100 | 90 days supply | Qty: 90 | Fill #2

## 2019-12-11 MED FILL — ESTRADIOL 0.05 MG/24HR PTTW: 0.05 | 84 days supply | Qty: 24 | Fill #3

## 2020-01-12 DIAGNOSIS — E559 Vitamin D deficiency, unspecified: Secondary | ICD-10-CM | POA: Diagnosis not present

## 2020-01-12 DIAGNOSIS — R718 Other abnormality of red blood cells: Secondary | ICD-10-CM | POA: Diagnosis not present

## 2020-01-12 DIAGNOSIS — E0590 Thyrotoxicosis, unspecified without thyrotoxic crisis or storm: Secondary | ICD-10-CM | POA: Diagnosis not present

## 2020-01-28 DIAGNOSIS — R971 Elevated cancer antigen 125 [CA 125]: Secondary | ICD-10-CM | POA: Diagnosis not present

## 2020-01-28 DIAGNOSIS — Z8041 Family history of malignant neoplasm of ovary: Secondary | ICD-10-CM | POA: Diagnosis not present

## 2020-01-28 DIAGNOSIS — Z7689 Persons encountering health services in other specified circumstances: Secondary | ICD-10-CM | POA: Diagnosis not present

## 2020-03-09 ENCOUNTER — Other Ambulatory Visit (HOSPITAL_COMMUNITY): Payer: Self-pay | Admitting: Obstetrics and Gynecology

## 2020-03-09 MED FILL — PROGESTERONE 100 MG CAPS: 100 | 90 days supply | Qty: 90 | Fill #0

## 2020-03-09 MED FILL — ESTRADIOL 0.05 MG/24HR PTTW: 0.05 | 84 days supply | Qty: 24 | Fill #0

## 2020-03-30 ENCOUNTER — Other Ambulatory Visit (HOSPITAL_COMMUNITY): Payer: Self-pay | Admitting: Endocrinology

## 2020-03-30 MED FILL — methIMAzole 10 MG TABS: 10 | 90 days supply | Qty: 90 | Fill #0

## 2020-04-12 DIAGNOSIS — Z6821 Body mass index (BMI) 21.0-21.9, adult: Secondary | ICD-10-CM | POA: Diagnosis not present

## 2020-04-12 DIAGNOSIS — Z01419 Encounter for gynecological examination (general) (routine) without abnormal findings: Secondary | ICD-10-CM | POA: Diagnosis not present

## 2020-04-19 DIAGNOSIS — Z13220 Encounter for screening for lipoid disorders: Secondary | ICD-10-CM | POA: Diagnosis not present

## 2020-04-19 DIAGNOSIS — Z1321 Encounter for screening for nutritional disorder: Secondary | ICD-10-CM | POA: Diagnosis not present

## 2020-04-19 DIAGNOSIS — Z13228 Encounter for screening for other metabolic disorders: Secondary | ICD-10-CM | POA: Diagnosis not present

## 2020-04-19 DIAGNOSIS — Z8639 Personal history of other endocrine, nutritional and metabolic disease: Secondary | ICD-10-CM | POA: Diagnosis not present

## 2020-04-19 DIAGNOSIS — Z13820 Encounter for screening for osteoporosis: Secondary | ICD-10-CM | POA: Diagnosis not present

## 2020-04-19 DIAGNOSIS — Z1329 Encounter for screening for other suspected endocrine disorder: Secondary | ICD-10-CM | POA: Diagnosis not present

## 2020-05-03 DIAGNOSIS — Z1231 Encounter for screening mammogram for malignant neoplasm of breast: Secondary | ICD-10-CM | POA: Diagnosis not present

## 2020-05-03 DIAGNOSIS — N6459 Other signs and symptoms in breast: Secondary | ICD-10-CM | POA: Diagnosis not present

## 2020-05-17 DIAGNOSIS — B079 Viral wart, unspecified: Secondary | ICD-10-CM | POA: Diagnosis not present

## 2020-05-17 DIAGNOSIS — E785 Hyperlipidemia, unspecified: Secondary | ICD-10-CM | POA: Diagnosis not present

## 2020-05-17 DIAGNOSIS — D1801 Hemangioma of skin and subcutaneous tissue: Secondary | ICD-10-CM | POA: Diagnosis not present

## 2020-05-17 DIAGNOSIS — D485 Neoplasm of uncertain behavior of skin: Secondary | ICD-10-CM | POA: Diagnosis not present

## 2020-05-17 DIAGNOSIS — L820 Inflamed seborrheic keratosis: Secondary | ICD-10-CM | POA: Diagnosis not present

## 2020-06-09 ENCOUNTER — Other Ambulatory Visit (HOSPITAL_BASED_OUTPATIENT_CLINIC_OR_DEPARTMENT_OTHER): Payer: Self-pay

## 2020-06-14 MED FILL — PROGESTERONE 100 MG CAPS: 100 | 90 days supply | Qty: 90 | Fill #0

## 2020-06-14 MED FILL — ESTRADIOL 0.05 MG/24HR PTTW: 0.05 | 84 days supply | Qty: 24 | Fill #0

## 2020-07-12 ENCOUNTER — Other Ambulatory Visit (HOSPITAL_COMMUNITY): Payer: Self-pay

## 2020-07-12 MED FILL — Methimazole Tab 10 MG: ORAL | 90 days supply | Qty: 90 | Fill #0 | Status: AC

## 2020-07-25 ENCOUNTER — Other Ambulatory Visit: Payer: Self-pay

## 2020-07-25 ENCOUNTER — Ambulatory Visit (INDEPENDENT_AMBULATORY_CARE_PROVIDER_SITE_OTHER): Payer: 59 | Admitting: Family Medicine

## 2020-07-25 VITALS — BP 110/78 | Ht 69.0 in | Wt 142.0 lb

## 2020-07-25 DIAGNOSIS — M7711 Lateral epicondylitis, right elbow: Secondary | ICD-10-CM | POA: Diagnosis not present

## 2020-07-25 NOTE — Progress Notes (Signed)
PCP: Leeroy Cha, MD  Subjective:   HPI: Patient is a 60 y.o. female here for right elbow pain.  Endorsing right lateral elbow pain x2 months.  Patient does not remember hearing any popping or clicking in elbow joint.  No known injury or trauma.  Patient endorses carrying heavy objects such as work bag.  Has opted for a rolling bag in the left arm.  Does light weights 5 to 10 pounds upper extremity workout has stopped and has only been doing exercises on the left.  She is also a gardener.  Does not radiate described as a sore feeling.  No loss of sensation tingling or numbness.   Past Medical History:  Diagnosis Date  . Allergy   . Family history of breast cancer   . Family history of colon cancer   . Family history of ovarian cancer   . Family history of pancreatic cancer   . Post-operative nausea and vomiting    Pt states she has "shaking"after sedation!  . Reactive airway disease    triggers due to perfumes, smoke and animals    Current Outpatient Medications on File Prior to Visit  Medication Sig Dispense Refill  . acetaminophen (TYLENOL) 500 MG tablet Take 500 mg by mouth as needed.    . cholecalciferol (VITAMIN D) 400 units TABS tablet Take 2,000 Units by mouth.    . estradiol (VIVELLE-DOT) 0.05 MG/24HR patch APPLY 1 PATCH TO SKIN TWICE A WEEK 24 patch 5  . estradiol (VIVELLE-DOT) 0.05 MG/24HR patch APPLY 1 PATCH TO SKIN TWICE A WEEK 24 patch 0  . ibuprofen (ADVIL,MOTRIN) 200 MG tablet Take 200 mg by mouth as needed.    . loratadine (CLARITIN) 10 MG tablet Take 10 mg by mouth daily as needed for allergies.    . methimazole (TAPAZOLE) 10 MG tablet Take 10 mg by mouth daily. Takes Mon -Sat.    . methimazole (TAPAZOLE) 10 MG tablet TAKE 1 TABLET BY MOUTH DAILY 90 tablet 4  . MINIVELLE 0.05 MG/24HR patch Change patch twice a week.  2  . progesterone (PROMETRIUM) 100 MG capsule daily.   11  . progesterone (PROMETRIUM) 100 MG capsule TAKE 1 CAPSULE BY MOUTH NIGHTLY AT  BEDTIME 90 capsule 5  . progesterone (PROMETRIUM) 100 MG capsule TAKE 1 CAPSULE BY MOUTH NIGHTLY AT BEDTIME 90 capsule 0   No current facility-administered medications on file prior to visit.    Past Surgical History:  Procedure Laterality Date  . [place] , 2002  . PLACEMENT OF BREAST IMPLANTS  1998  . TONSILECTOMY, ADENOIDECTOMY, BILATERAL MYRINGOTOMY AND TUBES  1984   Per pt, no tubes put in!  . TUBAL LIGATION  2001    Allergies  Allergen Reactions  . Iohexol Itching    Pt.states her throat started itching after injection, Dr. Polly Cobia spoke with the patient and recommends a 13 hr prep in the future  . Ancef [Cefazolin]     Causes itching  . Penicillins     Causes itching.  . Sulfa Antibiotics     Causes itching/ drinking red wine with med caused wheezing!    Social History   Socioeconomic History  . Marital status: Married    Spouse name: Not on file  . Number of children: Not on file  . Years of education: Not on file  . Highest education level: Not on file  Occupational History  . Not on file  Tobacco Use  . Smoking status: Never Smoker  .  Smokeless tobacco: Never Used  Vaping Use  . Vaping Use: Never used  Substance and Sexual Activity  . Alcohol use: No  . Drug use: No  . Sexual activity: Not on file  Other Topics Concern  . Not on file  Social History Narrative  . Not on file   Social Determinants of Health   Financial Resource Strain: Not on file  Food Insecurity: Not on file  Transportation Needs: Not on file  Physical Activity: Not on file  Stress: Not on file  Social Connections: Not on file  Intimate Partner Violence: Not on file    Family History  Problem Relation Age of Onset  . Cancer Mother 90       peritoneal  . Hypertension Mother   . Cancer Father 31       melanoma  . Diabetes Father   . Hypertension Father   . Breast cancer Maternal Grandmother 67  . Diabetes Maternal Grandfather   . Pancreatic  cancer Maternal Grandfather 47  . Diabetes Paternal Grandmother   . Diabetes Paternal Grandfather   . Alzheimer's disease Paternal Grandfather   . Hyperlipidemia Brother   . Pancreatic cancer Paternal Aunt 7  . Colon cancer Cousin 45  . Colon cancer Cousin 74  . Pancreatic cancer Other        mat great uncle and mat great great aunt  . Breast cancer Maternal Aunt   . Alzheimer's disease Paternal Uncle   . Alzheimer's disease Paternal Aunt     BP 110/78   Ht 5\' 9"  (1.753 m)   Wt 142 lb (64.4 kg)   LMP 01/04/2012   BMI 20.97 kg/m   [place] Adult Exercise 07/25/2020  Frequency of aerobic exercise (# of days/week) 3  Average time in minutes 20  Frequency of strengthening activities (# of days/week) 3    No flowsheet data found.  Review of Systems: See HPI above.     Objective:  Physical Exam:  Gen: NAD, comfortable in exam room  Right elbow Inspection: No obvious skin changes, bruising, or swelling Palpation: TTP at right lateral epicondyle and extensor surface of forearm Motion: FROM with pain on resisted 3rd digit extension Neuro: 2+ equal reflexes in triceps, biceps, brachioradialis tendons Strength: 5/5. Pain reproduced at lateral epicondyle with supination Special tests:  NV intact distal BUEs.   Assessment & Plan:  1. Tennis Elbow/Lateral epicondylitis -Ice, voltaren gel -Exercises, stretches in AVS -Counterforce brace/sleeve -Consider nitro patch/injection if no improvement -Follow up in 6 weeks or sooner if needed.

## 2020-07-25 NOTE — Patient Instructions (Signed)
You have lateral epicondylitis Try to avoid painful activities as much as possible. Ice the area 3-4 times a day for 15 minutes at a time. Voltaren gel up to 4 times a day as needed for pain and inflammation. Counterforce brace or sleeve as directed can help unload area - wear this regularly if it provides you with relief. Hammer rotation exercise, wrist extension exercise with 1 pound weight - 3 sets of 10 once a day.   Stretching - hold for 20-30 seconds and repeat 3 times. Consider physical therapy, nitro patches, injection if not improving. Follow up in 6 weeks but call me sooner if you're struggling.

## 2020-07-26 ENCOUNTER — Encounter: Payer: Self-pay | Admitting: Family Medicine

## 2020-09-12 ENCOUNTER — Other Ambulatory Visit (HOSPITAL_COMMUNITY): Payer: Self-pay

## 2020-09-12 MED FILL — Estradiol TD Patch Twice Weekly 0.05 MG/24HR: TRANSDERMAL | 84 days supply | Qty: 24 | Fill #0 | Status: AC

## 2020-09-14 ENCOUNTER — Other Ambulatory Visit (HOSPITAL_COMMUNITY): Payer: Self-pay

## 2020-09-14 MED FILL — Progesterone Cap 100 MG: ORAL | 90 days supply | Qty: 90 | Fill #0 | Status: AC

## 2020-10-12 ENCOUNTER — Other Ambulatory Visit (HOSPITAL_COMMUNITY): Payer: Self-pay

## 2020-10-12 DIAGNOSIS — J0110 Acute frontal sinusitis, unspecified: Secondary | ICD-10-CM | POA: Diagnosis not present

## 2020-10-12 DIAGNOSIS — U071 COVID-19: Secondary | ICD-10-CM | POA: Diagnosis not present

## 2020-10-12 DIAGNOSIS — Z8639 Personal history of other endocrine, nutritional and metabolic disease: Secondary | ICD-10-CM | POA: Diagnosis not present

## 2020-10-12 MED ORDER — PAXLOVID 20 X 150 MG & 10 X 100MG PO TBPK
ORAL_TABLET | ORAL | 0 refills | Status: AC
  Filled 2020-10-12 – 2020-10-13 (×3): qty 30, 5d supply, fill #0

## 2020-10-13 ENCOUNTER — Other Ambulatory Visit (HOSPITAL_COMMUNITY): Payer: Self-pay

## 2020-11-07 DIAGNOSIS — E0590 Thyrotoxicosis, unspecified without thyrotoxic crisis or storm: Secondary | ICD-10-CM | POA: Diagnosis not present

## 2020-11-15 ENCOUNTER — Other Ambulatory Visit (HOSPITAL_COMMUNITY): Payer: Self-pay

## 2020-11-15 DIAGNOSIS — R000 Tachycardia, unspecified: Secondary | ICD-10-CM | POA: Diagnosis not present

## 2020-11-15 DIAGNOSIS — E559 Vitamin D deficiency, unspecified: Secondary | ICD-10-CM | POA: Diagnosis not present

## 2020-11-15 DIAGNOSIS — E0590 Thyrotoxicosis, unspecified without thyrotoxic crisis or storm: Secondary | ICD-10-CM | POA: Diagnosis not present

## 2020-11-15 DIAGNOSIS — E012 Iodine-deficiency related (endemic) goiter, unspecified: Secondary | ICD-10-CM | POA: Diagnosis not present

## 2020-11-15 MED ORDER — METOPROLOL SUCCINATE ER 25 MG PO TB24
ORAL_TABLET | ORAL | 0 refills | Status: AC
  Filled 2020-11-15: qty 30, 30d supply, fill #0

## 2020-11-15 MED ORDER — METHIMAZOLE 10 MG PO TABS
ORAL_TABLET | ORAL | 1 refills | Status: AC
  Filled 2020-11-15: qty 60, 30d supply, fill #0
  Filled 2020-12-22: qty 60, 30d supply, fill #1

## 2020-12-09 ENCOUNTER — Other Ambulatory Visit (HOSPITAL_COMMUNITY): Payer: Self-pay

## 2020-12-09 MED FILL — Estradiol TD Patch Twice Weekly 0.05 MG/24HR: TRANSDERMAL | 84 days supply | Qty: 24 | Fill #1 | Status: AC

## 2020-12-12 ENCOUNTER — Other Ambulatory Visit (HOSPITAL_COMMUNITY): Payer: Self-pay

## 2020-12-12 MED FILL — Progesterone Cap 100 MG: ORAL | 90 days supply | Qty: 90 | Fill #1 | Status: AC

## 2020-12-22 ENCOUNTER — Other Ambulatory Visit (HOSPITAL_COMMUNITY): Payer: Self-pay

## 2020-12-27 DIAGNOSIS — E0590 Thyrotoxicosis, unspecified without thyrotoxic crisis or storm: Secondary | ICD-10-CM | POA: Diagnosis not present

## 2021-01-13 DIAGNOSIS — E785 Hyperlipidemia, unspecified: Secondary | ICD-10-CM | POA: Diagnosis not present

## 2021-01-13 DIAGNOSIS — E061 Subacute thyroiditis: Secondary | ICD-10-CM | POA: Diagnosis not present

## 2021-01-13 DIAGNOSIS — R002 Palpitations: Secondary | ICD-10-CM | POA: Diagnosis not present

## 2021-01-13 DIAGNOSIS — E0500 Thyrotoxicosis with diffuse goiter without thyrotoxic crisis or storm: Secondary | ICD-10-CM | POA: Diagnosis not present

## 2021-01-31 ENCOUNTER — Other Ambulatory Visit (HOSPITAL_COMMUNITY): Payer: Self-pay

## 2021-01-31 MED FILL — Methimazole Tab 10 MG: ORAL | 90 days supply | Qty: 90 | Fill #1 | Status: AC

## 2021-03-02 ENCOUNTER — Other Ambulatory Visit (HOSPITAL_COMMUNITY): Payer: Self-pay

## 2021-03-02 MED FILL — Estradiol TD Patch Twice Weekly 0.05 MG/24HR: TRANSDERMAL | 84 days supply | Qty: 24 | Fill #2 | Status: AC

## 2021-03-03 ENCOUNTER — Other Ambulatory Visit (HOSPITAL_COMMUNITY): Payer: Self-pay

## 2021-03-14 ENCOUNTER — Other Ambulatory Visit (HOSPITAL_COMMUNITY): Payer: Self-pay

## 2021-03-14 MED ORDER — PROGESTERONE MICRONIZED 100 MG PO CAPS
ORAL_CAPSULE | ORAL | 0 refills | Status: DC
  Filled 2021-03-14: qty 90, 90d supply, fill #0

## 2021-04-11 DIAGNOSIS — S20211A Contusion of right front wall of thorax, initial encounter: Secondary | ICD-10-CM | POA: Diagnosis not present

## 2021-04-11 DIAGNOSIS — S20212A Contusion of left front wall of thorax, initial encounter: Secondary | ICD-10-CM | POA: Diagnosis not present

## 2021-04-11 DIAGNOSIS — M546 Pain in thoracic spine: Secondary | ICD-10-CM | POA: Diagnosis not present

## 2021-04-13 ENCOUNTER — Other Ambulatory Visit (HOSPITAL_COMMUNITY): Payer: Self-pay

## 2021-04-13 DIAGNOSIS — Z01419 Encounter for gynecological examination (general) (routine) without abnormal findings: Secondary | ICD-10-CM | POA: Diagnosis not present

## 2021-04-13 DIAGNOSIS — Z6822 Body mass index (BMI) 22.0-22.9, adult: Secondary | ICD-10-CM | POA: Diagnosis not present

## 2021-04-14 ENCOUNTER — Other Ambulatory Visit (HOSPITAL_COMMUNITY): Payer: Self-pay

## 2021-04-14 MED ORDER — METHIMAZOLE 10 MG PO TABS
ORAL_TABLET | ORAL | 5 refills | Status: AC
  Filled 2021-04-14: qty 60, 30d supply, fill #0
  Filled 2021-05-12: qty 180, 90d supply, fill #1
  Filled 2021-09-09: qty 60, 30d supply, fill #2

## 2021-05-09 DIAGNOSIS — D252 Subserosal leiomyoma of uterus: Secondary | ICD-10-CM | POA: Diagnosis not present

## 2021-05-09 DIAGNOSIS — Z8041 Family history of malignant neoplasm of ovary: Secondary | ICD-10-CM | POA: Diagnosis not present

## 2021-05-12 ENCOUNTER — Other Ambulatory Visit (HOSPITAL_COMMUNITY): Payer: Self-pay

## 2021-05-16 DIAGNOSIS — E0590 Thyrotoxicosis, unspecified without thyrotoxic crisis or storm: Secondary | ICD-10-CM | POA: Diagnosis not present

## 2021-05-23 DIAGNOSIS — E012 Iodine-deficiency related (endemic) goiter, unspecified: Secondary | ICD-10-CM | POA: Diagnosis not present

## 2021-05-23 DIAGNOSIS — E559 Vitamin D deficiency, unspecified: Secondary | ICD-10-CM | POA: Diagnosis not present

## 2021-05-23 DIAGNOSIS — E0590 Thyrotoxicosis, unspecified without thyrotoxic crisis or storm: Secondary | ICD-10-CM | POA: Diagnosis not present

## 2021-06-14 ENCOUNTER — Other Ambulatory Visit (HOSPITAL_COMMUNITY): Payer: Self-pay

## 2021-06-14 MED ORDER — PROGESTERONE MICRONIZED 100 MG PO CAPS
ORAL_CAPSULE | ORAL | 0 refills | Status: DC
  Filled 2021-06-14: qty 90, 90d supply, fill #0

## 2021-06-17 ENCOUNTER — Other Ambulatory Visit (HOSPITAL_COMMUNITY): Payer: Self-pay

## 2021-06-19 ENCOUNTER — Other Ambulatory Visit (HOSPITAL_COMMUNITY): Payer: Self-pay

## 2021-06-19 DIAGNOSIS — Z1231 Encounter for screening mammogram for malignant neoplasm of breast: Secondary | ICD-10-CM | POA: Diagnosis not present

## 2021-06-19 MED ORDER — ESTRADIOL 0.05 MG/24HR TD PTTW
MEDICATED_PATCH | TRANSDERMAL | 5 refills | Status: AC
  Filled 2021-06-19: qty 24, 84d supply, fill #0
  Filled 2021-09-09: qty 24, 84d supply, fill #1
  Filled 2021-11-29: qty 24, 84d supply, fill #2
  Filled 2022-02-26: qty 24, 84d supply, fill #3

## 2021-06-20 ENCOUNTER — Other Ambulatory Visit (HOSPITAL_COMMUNITY): Payer: Self-pay

## 2021-06-21 ENCOUNTER — Other Ambulatory Visit: Payer: Self-pay | Admitting: Obstetrics and Gynecology

## 2021-06-21 ENCOUNTER — Other Ambulatory Visit (HOSPITAL_COMMUNITY): Payer: Self-pay

## 2021-06-21 DIAGNOSIS — R928 Other abnormal and inconclusive findings on diagnostic imaging of breast: Secondary | ICD-10-CM

## 2021-06-23 ENCOUNTER — Other Ambulatory Visit (HOSPITAL_COMMUNITY): Payer: Self-pay

## 2021-07-04 DIAGNOSIS — E0590 Thyrotoxicosis, unspecified without thyrotoxic crisis or storm: Secondary | ICD-10-CM | POA: Diagnosis not present

## 2021-07-12 ENCOUNTER — Ambulatory Visit
Admission: RE | Admit: 2021-07-12 | Discharge: 2021-07-12 | Disposition: A | Discharge status: Home / Self Care | Payer: 59 | Source: Ambulatory Visit | Attending: Obstetrics and Gynecology | Admitting: Obstetrics and Gynecology

## 2021-07-12 ENCOUNTER — Ambulatory Visit: Payer: 59

## 2021-07-12 ENCOUNTER — Other Ambulatory Visit: Payer: Self-pay | Admitting: Obstetrics and Gynecology

## 2021-07-12 DIAGNOSIS — R928 Other abnormal and inconclusive findings on diagnostic imaging of breast: Secondary | ICD-10-CM

## 2021-09-09 ENCOUNTER — Other Ambulatory Visit (HOSPITAL_COMMUNITY): Payer: Self-pay

## 2021-09-12 ENCOUNTER — Other Ambulatory Visit (HOSPITAL_COMMUNITY): Payer: Self-pay

## 2021-09-12 ENCOUNTER — Encounter (HOSPITAL_COMMUNITY): Payer: Self-pay | Admitting: Pharmacist

## 2021-09-12 MED ORDER — PROGESTERONE MICRONIZED 100 MG PO CAPS
ORAL_CAPSULE | ORAL | 2 refills | Status: DC
  Filled 2021-09-12: qty 90, 90d supply, fill #0
  Filled 2021-11-29: qty 90, 90d supply, fill #1
  Filled 2022-02-26: qty 90, 90d supply, fill #2

## 2021-09-13 ENCOUNTER — Other Ambulatory Visit (HOSPITAL_COMMUNITY): Payer: Self-pay

## 2021-09-14 ENCOUNTER — Other Ambulatory Visit (HOSPITAL_COMMUNITY): Payer: Self-pay

## 2021-11-10 DIAGNOSIS — E0590 Thyrotoxicosis, unspecified without thyrotoxic crisis or storm: Secondary | ICD-10-CM | POA: Diagnosis not present

## 2021-11-14 ENCOUNTER — Other Ambulatory Visit (HOSPITAL_COMMUNITY): Payer: Self-pay

## 2021-11-14 MED ORDER — METHIMAZOLE 10 MG PO TABS
ORAL_TABLET | ORAL | 6 refills | Status: DC
  Filled 2021-11-14: qty 120, 84d supply, fill #0
  Filled 2021-11-14: qty 40, 28d supply, fill #0
  Filled 2022-02-26: qty 120, 84d supply, fill #1
  Filled 2022-05-28: qty 40, 28d supply, fill #2

## 2021-11-29 ENCOUNTER — Other Ambulatory Visit (HOSPITAL_COMMUNITY): Payer: Self-pay

## 2022-02-06 ENCOUNTER — Other Ambulatory Visit: Payer: Self-pay | Admitting: Internal Medicine

## 2022-02-06 DIAGNOSIS — Z8639 Personal history of other endocrine, nutritional and metabolic disease: Secondary | ICD-10-CM | POA: Diagnosis not present

## 2022-02-06 DIAGNOSIS — J309 Allergic rhinitis, unspecified: Secondary | ICD-10-CM | POA: Diagnosis not present

## 2022-02-06 DIAGNOSIS — Z0000 Encounter for general adult medical examination without abnormal findings: Secondary | ICD-10-CM | POA: Diagnosis not present

## 2022-02-06 DIAGNOSIS — E785 Hyperlipidemia, unspecified: Secondary | ICD-10-CM | POA: Diagnosis not present

## 2022-02-06 DIAGNOSIS — Z23 Encounter for immunization: Secondary | ICD-10-CM | POA: Diagnosis not present

## 2022-02-06 DIAGNOSIS — E559 Vitamin D deficiency, unspecified: Secondary | ICD-10-CM | POA: Diagnosis not present

## 2022-02-15 ENCOUNTER — Ambulatory Visit
Admission: RE | Admit: 2022-02-15 | Discharge: 2022-02-15 | Disposition: A | Discharge status: Home / Self Care | Payer: No Typology Code available for payment source | Source: Ambulatory Visit | Attending: Internal Medicine | Admitting: Internal Medicine

## 2022-02-15 DIAGNOSIS — E785 Hyperlipidemia, unspecified: Secondary | ICD-10-CM

## 2022-02-20 ENCOUNTER — Other Ambulatory Visit (HOSPITAL_COMMUNITY): Payer: Self-pay

## 2022-02-20 MED ORDER — ATORVASTATIN CALCIUM 10 MG PO TABS
10.0000 mg | ORAL_TABLET | ORAL | 0 refills | Status: DC
  Filled 2022-02-20: qty 24, 84d supply, fill #0
  Filled 2022-02-26: qty 8, 28d supply, fill #0
  Filled 2022-03-28: qty 8, 28d supply, fill #1

## 2022-02-22 ENCOUNTER — Other Ambulatory Visit (HOSPITAL_COMMUNITY): Payer: Self-pay

## 2022-02-26 ENCOUNTER — Other Ambulatory Visit (HOSPITAL_COMMUNITY): Payer: Self-pay

## 2022-02-27 ENCOUNTER — Other Ambulatory Visit (HOSPITAL_COMMUNITY): Payer: Self-pay

## 2022-03-23 ENCOUNTER — Other Ambulatory Visit: Payer: Self-pay | Admitting: Obstetrics and Gynecology

## 2022-03-23 DIAGNOSIS — Z1231 Encounter for screening mammogram for malignant neoplasm of breast: Secondary | ICD-10-CM

## 2022-03-28 ENCOUNTER — Other Ambulatory Visit (HOSPITAL_COMMUNITY): Payer: Self-pay

## 2022-04-18 ENCOUNTER — Other Ambulatory Visit (HOSPITAL_COMMUNITY): Payer: Self-pay

## 2022-04-18 ENCOUNTER — Other Ambulatory Visit: Payer: Self-pay

## 2022-04-18 DIAGNOSIS — R319 Hematuria, unspecified: Secondary | ICD-10-CM | POA: Diagnosis not present

## 2022-04-18 DIAGNOSIS — Z124 Encounter for screening for malignant neoplasm of cervix: Secondary | ICD-10-CM | POA: Diagnosis not present

## 2022-04-18 DIAGNOSIS — Z1151 Encounter for screening for human papillomavirus (HPV): Secondary | ICD-10-CM | POA: Diagnosis not present

## 2022-04-18 DIAGNOSIS — Z6821 Body mass index (BMI) 21.0-21.9, adult: Secondary | ICD-10-CM | POA: Diagnosis not present

## 2022-04-18 DIAGNOSIS — Z01419 Encounter for gynecological examination (general) (routine) without abnormal findings: Secondary | ICD-10-CM | POA: Diagnosis not present

## 2022-04-18 MED ORDER — PROGESTERONE MICRONIZED 100 MG PO CAPS
100.0000 mg | ORAL_CAPSULE | Freq: Every day | ORAL | 6 refills | Status: DC
  Filled 2022-04-18: qty 90, 90d supply, fill #0
  Filled 2022-08-29 – 2022-08-31 (×2): qty 90, 90d supply, fill #1
  Filled 2022-12-06: qty 90, 90d supply, fill #2
  Filled 2023-02-27: qty 90, 90d supply, fill #3

## 2022-04-18 MED ORDER — ESTRADIOL 0.05 MG/24HR TD PTTW
1.0000 | MEDICATED_PATCH | TRANSDERMAL | 5 refills | Status: DC
  Filled 2022-04-18: qty 24, 84d supply, fill #0
  Filled 2022-08-29 – 2022-08-31 (×2): qty 24, 84d supply, fill #1
  Filled 2022-11-15 – 2022-11-16 (×2): qty 24, 84d supply, fill #2
  Filled 2023-02-08: qty 24, 84d supply, fill #3

## 2022-04-19 ENCOUNTER — Other Ambulatory Visit: Payer: Self-pay

## 2022-04-25 ENCOUNTER — Encounter: Payer: Self-pay | Admitting: Gastroenterology

## 2022-04-27 DIAGNOSIS — E785 Hyperlipidemia, unspecified: Secondary | ICD-10-CM | POA: Diagnosis not present

## 2022-04-30 DIAGNOSIS — R319 Hematuria, unspecified: Secondary | ICD-10-CM | POA: Diagnosis not present

## 2022-04-30 DIAGNOSIS — Z8041 Family history of malignant neoplasm of ovary: Secondary | ICD-10-CM | POA: Diagnosis not present

## 2022-05-02 ENCOUNTER — Other Ambulatory Visit (HOSPITAL_COMMUNITY): Payer: Self-pay

## 2022-05-02 MED ORDER — PRAVASTATIN SODIUM 20 MG PO TABS
20.0000 mg | ORAL_TABLET | Freq: Every day | ORAL | 5 refills | Status: DC
  Filled 2022-05-02: qty 30, 30d supply, fill #0
  Filled 2022-07-05: qty 30, 30d supply, fill #1
  Filled 2022-08-07: qty 30, 30d supply, fill #2
  Filled 2022-10-08: qty 30, 30d supply, fill #3
  Filled 2022-11-30: qty 30, 30d supply, fill #4
  Filled 2022-12-24: qty 30, 30d supply, fill #5

## 2022-05-15 ENCOUNTER — Other Ambulatory Visit (HOSPITAL_COMMUNITY): Payer: Self-pay

## 2022-05-16 DIAGNOSIS — E0590 Thyrotoxicosis, unspecified without thyrotoxic crisis or storm: Secondary | ICD-10-CM | POA: Diagnosis not present

## 2022-05-22 DIAGNOSIS — E0590 Thyrotoxicosis, unspecified without thyrotoxic crisis or storm: Secondary | ICD-10-CM | POA: Diagnosis not present

## 2022-05-22 DIAGNOSIS — E559 Vitamin D deficiency, unspecified: Secondary | ICD-10-CM | POA: Diagnosis not present

## 2022-05-22 DIAGNOSIS — E012 Iodine-deficiency related (endemic) goiter, unspecified: Secondary | ICD-10-CM | POA: Diagnosis not present

## 2022-05-23 ENCOUNTER — Other Ambulatory Visit (HOSPITAL_COMMUNITY): Payer: Self-pay

## 2022-05-23 DIAGNOSIS — N898 Other specified noninflammatory disorders of vagina: Secondary | ICD-10-CM | POA: Diagnosis not present

## 2022-05-23 DIAGNOSIS — R300 Dysuria: Secondary | ICD-10-CM | POA: Diagnosis not present

## 2022-05-23 MED ORDER — METRONIDAZOLE 0.75 % VA GEL
VAGINAL | 0 refills | Status: AC
  Filled 2022-05-23: qty 70, 7d supply, fill #0

## 2022-05-23 MED ORDER — FLUCONAZOLE 150 MG PO TABS
150.0000 mg | ORAL_TABLET | Freq: Once | ORAL | 1 refills | Status: AC
  Filled 2022-05-23: qty 2, 3d supply, fill #0

## 2022-05-23 MED ORDER — NITROFURANTOIN MONOHYD MACRO 100 MG PO CAPS
100.0000 mg | ORAL_CAPSULE | Freq: Two times a day (BID) | ORAL | 0 refills | Status: AC
  Filled 2022-05-23: qty 14, 7d supply, fill #0

## 2022-05-28 ENCOUNTER — Other Ambulatory Visit: Payer: Self-pay

## 2022-05-28 ENCOUNTER — Other Ambulatory Visit (HOSPITAL_COMMUNITY): Payer: Self-pay

## 2022-06-26 DIAGNOSIS — E0590 Thyrotoxicosis, unspecified without thyrotoxic crisis or storm: Secondary | ICD-10-CM | POA: Diagnosis not present

## 2022-06-27 ENCOUNTER — Other Ambulatory Visit (HOSPITAL_COMMUNITY): Payer: Self-pay

## 2022-06-27 MED ORDER — METHIMAZOLE 5 MG PO TABS
5.0000 mg | ORAL_TABLET | Freq: Every day | ORAL | 5 refills | Status: DC
  Filled 2022-06-27: qty 30, 30d supply, fill #0
  Filled 2022-08-07: qty 30, 30d supply, fill #1
  Filled 2022-09-11: qty 30, 30d supply, fill #2

## 2022-07-03 ENCOUNTER — Other Ambulatory Visit (HOSPITAL_BASED_OUTPATIENT_CLINIC_OR_DEPARTMENT_OTHER): Payer: Self-pay

## 2022-07-03 ENCOUNTER — Other Ambulatory Visit (HOSPITAL_COMMUNITY): Payer: Self-pay

## 2022-07-03 MED ORDER — METHIMAZOLE 5 MG PO TABS
5.0000 mg | ORAL_TABLET | Freq: Every day | ORAL | 5 refills | Status: DC
  Filled 2022-07-03 – 2022-10-08 (×2): qty 30, 30d supply, fill #0

## 2022-07-05 ENCOUNTER — Other Ambulatory Visit (HOSPITAL_COMMUNITY): Payer: Self-pay

## 2022-07-27 ENCOUNTER — Ambulatory Visit
Admission: RE | Admit: 2022-07-27 | Discharge: 2022-07-27 | Disposition: A | Discharge status: Home / Self Care | Payer: 59 | Source: Ambulatory Visit | Attending: Obstetrics and Gynecology | Admitting: Obstetrics and Gynecology

## 2022-07-27 DIAGNOSIS — Z1231 Encounter for screening mammogram for malignant neoplasm of breast: Secondary | ICD-10-CM | POA: Diagnosis not present

## 2022-08-07 ENCOUNTER — Other Ambulatory Visit: Payer: Self-pay

## 2022-08-30 ENCOUNTER — Other Ambulatory Visit (HOSPITAL_COMMUNITY): Payer: Self-pay

## 2022-08-30 ENCOUNTER — Other Ambulatory Visit: Payer: Self-pay

## 2022-08-31 ENCOUNTER — Other Ambulatory Visit (HOSPITAL_COMMUNITY): Payer: Self-pay

## 2022-08-31 ENCOUNTER — Other Ambulatory Visit: Payer: Self-pay

## 2022-09-11 ENCOUNTER — Other Ambulatory Visit (HOSPITAL_COMMUNITY): Payer: Self-pay

## 2022-09-13 ENCOUNTER — Other Ambulatory Visit (HOSPITAL_COMMUNITY): Payer: Self-pay

## 2022-10-08 ENCOUNTER — Other Ambulatory Visit: Payer: Self-pay

## 2022-10-08 ENCOUNTER — Other Ambulatory Visit (HOSPITAL_COMMUNITY): Payer: Self-pay

## 2022-10-29 DIAGNOSIS — E0590 Thyrotoxicosis, unspecified without thyrotoxic crisis or storm: Secondary | ICD-10-CM | POA: Diagnosis not present

## 2022-10-30 ENCOUNTER — Other Ambulatory Visit (HOSPITAL_COMMUNITY): Payer: Self-pay

## 2022-10-30 MED ORDER — METHIMAZOLE 5 MG PO TABS
ORAL_TABLET | ORAL | 5 refills | Status: DC
  Filled 2022-10-30 – 2022-11-01 (×3): qty 38, 30d supply, fill #0
  Filled 2022-12-06: qty 114, 90d supply, fill #1

## 2022-10-31 ENCOUNTER — Other Ambulatory Visit (HOSPITAL_COMMUNITY): Payer: Self-pay

## 2022-11-01 ENCOUNTER — Other Ambulatory Visit (HOSPITAL_COMMUNITY): Payer: Self-pay

## 2022-11-05 ENCOUNTER — Other Ambulatory Visit (HOSPITAL_BASED_OUTPATIENT_CLINIC_OR_DEPARTMENT_OTHER): Payer: Self-pay

## 2022-11-15 ENCOUNTER — Other Ambulatory Visit (HOSPITAL_COMMUNITY): Payer: Self-pay

## 2022-11-15 ENCOUNTER — Encounter (HOSPITAL_COMMUNITY): Payer: Self-pay

## 2022-11-16 ENCOUNTER — Other Ambulatory Visit (HOSPITAL_COMMUNITY): Payer: Self-pay

## 2022-11-30 ENCOUNTER — Other Ambulatory Visit (HOSPITAL_COMMUNITY): Payer: Self-pay

## 2022-12-06 ENCOUNTER — Other Ambulatory Visit (HOSPITAL_COMMUNITY): Payer: Self-pay

## 2022-12-06 ENCOUNTER — Other Ambulatory Visit: Payer: Self-pay

## 2022-12-12 ENCOUNTER — Other Ambulatory Visit (HOSPITAL_COMMUNITY): Payer: Self-pay

## 2022-12-18 ENCOUNTER — Other Ambulatory Visit (HOSPITAL_COMMUNITY): Payer: Self-pay

## 2022-12-21 DIAGNOSIS — E0590 Thyrotoxicosis, unspecified without thyrotoxic crisis or storm: Secondary | ICD-10-CM | POA: Diagnosis not present

## 2023-01-16 DIAGNOSIS — E559 Vitamin D deficiency, unspecified: Secondary | ICD-10-CM | POA: Diagnosis not present

## 2023-01-16 DIAGNOSIS — Z23 Encounter for immunization: Secondary | ICD-10-CM | POA: Diagnosis not present

## 2023-01-16 DIAGNOSIS — E0590 Thyrotoxicosis, unspecified without thyrotoxic crisis or storm: Secondary | ICD-10-CM | POA: Diagnosis not present

## 2023-01-16 DIAGNOSIS — E012 Iodine-deficiency related (endemic) goiter, unspecified: Secondary | ICD-10-CM | POA: Diagnosis not present

## 2023-01-22 ENCOUNTER — Other Ambulatory Visit (HOSPITAL_COMMUNITY): Payer: Self-pay

## 2023-01-22 ENCOUNTER — Other Ambulatory Visit: Payer: Self-pay

## 2023-01-22 MED ORDER — PRAVASTATIN SODIUM 20 MG PO TABS
20.0000 mg | ORAL_TABLET | Freq: Every day | ORAL | 0 refills | Status: DC
  Filled 2023-01-22: qty 30, 30d supply, fill #0

## 2023-02-08 ENCOUNTER — Other Ambulatory Visit: Payer: Self-pay

## 2023-02-08 ENCOUNTER — Other Ambulatory Visit (HOSPITAL_COMMUNITY): Payer: Self-pay

## 2023-02-18 ENCOUNTER — Other Ambulatory Visit (HOSPITAL_COMMUNITY): Payer: Self-pay

## 2023-02-18 DIAGNOSIS — E559 Vitamin D deficiency, unspecified: Secondary | ICD-10-CM | POA: Diagnosis not present

## 2023-02-18 DIAGNOSIS — Z0000 Encounter for general adult medical examination without abnormal findings: Secondary | ICD-10-CM | POA: Diagnosis not present

## 2023-02-18 DIAGNOSIS — Z833 Family history of diabetes mellitus: Secondary | ICD-10-CM | POA: Diagnosis not present

## 2023-02-18 DIAGNOSIS — E0500 Thyrotoxicosis with diffuse goiter without thyrotoxic crisis or storm: Secondary | ICD-10-CM | POA: Diagnosis not present

## 2023-02-18 DIAGNOSIS — Z1211 Encounter for screening for malignant neoplasm of colon: Secondary | ICD-10-CM | POA: Diagnosis not present

## 2023-02-18 DIAGNOSIS — E785 Hyperlipidemia, unspecified: Secondary | ICD-10-CM | POA: Diagnosis not present

## 2023-02-18 DIAGNOSIS — J309 Allergic rhinitis, unspecified: Secondary | ICD-10-CM | POA: Diagnosis not present

## 2023-02-18 DIAGNOSIS — Z8041 Family history of malignant neoplasm of ovary: Secondary | ICD-10-CM | POA: Diagnosis not present

## 2023-02-18 MED ORDER — PRAVASTATIN SODIUM 20 MG PO TABS
20.0000 mg | ORAL_TABLET | Freq: Every day | ORAL | 5 refills | Status: DC
  Filled 2023-02-18: qty 90, 90d supply, fill #0
  Filled 2023-05-16: qty 90, 90d supply, fill #1
  Filled 2023-08-07: qty 90, 90d supply, fill #2
  Filled 2023-11-11: qty 90, 90d supply, fill #3
  Filled 2024-02-03: qty 90, 90d supply, fill #4

## 2023-02-18 MED ORDER — METHIMAZOLE 5 MG PO TABS
ORAL_TABLET | ORAL | 5 refills | Status: DC
  Filled 2023-02-18: qty 44, 28d supply, fill #0
  Filled 2024-01-25: qty 44, 28d supply, fill #1

## 2023-02-19 ENCOUNTER — Other Ambulatory Visit (HOSPITAL_COMMUNITY): Payer: Self-pay

## 2023-02-20 ENCOUNTER — Other Ambulatory Visit (HOSPITAL_COMMUNITY): Payer: Self-pay

## 2023-02-27 DIAGNOSIS — E0590 Thyrotoxicosis, unspecified without thyrotoxic crisis or storm: Secondary | ICD-10-CM | POA: Diagnosis not present

## 2023-02-28 ENCOUNTER — Other Ambulatory Visit (HOSPITAL_COMMUNITY): Payer: Self-pay

## 2023-02-28 MED ORDER — METHIMAZOLE 5 MG PO TABS
10.0000 mg | ORAL_TABLET | Freq: Every day | ORAL | 1 refills | Status: DC
  Filled 2023-02-28 – 2023-03-15 (×4): qty 60, 30d supply, fill #0
  Filled 2023-04-15: qty 60, 30d supply, fill #1

## 2023-03-01 ENCOUNTER — Other Ambulatory Visit (HOSPITAL_COMMUNITY): Payer: Self-pay

## 2023-03-02 ENCOUNTER — Other Ambulatory Visit: Payer: Self-pay

## 2023-03-15 ENCOUNTER — Other Ambulatory Visit (HOSPITAL_COMMUNITY): Payer: Self-pay

## 2023-03-18 ENCOUNTER — Other Ambulatory Visit: Payer: Self-pay

## 2023-04-15 ENCOUNTER — Other Ambulatory Visit: Payer: Self-pay

## 2023-04-22 ENCOUNTER — Other Ambulatory Visit (HOSPITAL_COMMUNITY): Payer: Self-pay

## 2023-04-22 DIAGNOSIS — Z6821 Body mass index (BMI) 21.0-21.9, adult: Secondary | ICD-10-CM | POA: Diagnosis not present

## 2023-04-22 DIAGNOSIS — Z1151 Encounter for screening for human papillomavirus (HPV): Secondary | ICD-10-CM | POA: Diagnosis not present

## 2023-04-22 DIAGNOSIS — Z124 Encounter for screening for malignant neoplasm of cervix: Secondary | ICD-10-CM | POA: Diagnosis not present

## 2023-04-22 DIAGNOSIS — Z01419 Encounter for gynecological examination (general) (routine) without abnormal findings: Secondary | ICD-10-CM | POA: Diagnosis not present

## 2023-04-23 ENCOUNTER — Other Ambulatory Visit (HOSPITAL_COMMUNITY): Payer: Self-pay

## 2023-04-23 ENCOUNTER — Other Ambulatory Visit (HOSPITAL_BASED_OUTPATIENT_CLINIC_OR_DEPARTMENT_OTHER): Payer: Self-pay

## 2023-04-23 ENCOUNTER — Other Ambulatory Visit: Payer: Self-pay

## 2023-04-23 MED ORDER — BETAMETHASONE VALERATE 0.1 % EX CREA
TOPICAL_CREAM | CUTANEOUS | 2 refills | Status: AC
  Filled 2023-04-23: qty 15, 7d supply, fill #0
  Filled 2023-04-25: qty 15, 7d supply, fill #1
  Filled 2023-05-01 – 2023-05-02 (×2): qty 15, 7d supply, fill #2

## 2023-04-23 MED ORDER — PROGESTERONE MICRONIZED 100 MG PO CAPS
100.0000 mg | ORAL_CAPSULE | Freq: Every evening | ORAL | 6 refills | Status: AC
  Filled 2023-04-23 – 2023-05-12 (×4): qty 90, 90d supply, fill #0
  Filled 2023-08-07: qty 90, 90d supply, fill #1
  Filled 2023-11-06: qty 90, 90d supply, fill #2
  Filled 2024-02-12: qty 90, 90d supply, fill #3

## 2023-04-23 MED ORDER — ESTRADIOL 0.05 MG/24HR TD PTTW
1.0000 | MEDICATED_PATCH | TRANSDERMAL | 6 refills | Status: AC
  Filled 2023-04-23: qty 24, 84d supply, fill #0
  Filled 2023-07-19: qty 24, 84d supply, fill #1
  Filled 2023-09-30: qty 24, 84d supply, fill #2
  Filled 2023-12-27: qty 24, 84d supply, fill #3
  Filled 2024-03-20: qty 24, 84d supply, fill #4

## 2023-04-24 ENCOUNTER — Other Ambulatory Visit: Payer: Self-pay

## 2023-04-25 ENCOUNTER — Other Ambulatory Visit: Payer: Self-pay

## 2023-04-25 ENCOUNTER — Other Ambulatory Visit (HOSPITAL_COMMUNITY): Payer: Self-pay

## 2023-04-25 DIAGNOSIS — E0590 Thyrotoxicosis, unspecified without thyrotoxic crisis or storm: Secondary | ICD-10-CM | POA: Diagnosis not present

## 2023-05-01 ENCOUNTER — Other Ambulatory Visit (HOSPITAL_COMMUNITY): Payer: Self-pay

## 2023-05-02 ENCOUNTER — Other Ambulatory Visit (HOSPITAL_COMMUNITY): Payer: Self-pay

## 2023-05-06 ENCOUNTER — Other Ambulatory Visit (HOSPITAL_COMMUNITY): Payer: Self-pay

## 2023-05-08 DIAGNOSIS — Z8041 Family history of malignant neoplasm of ovary: Secondary | ICD-10-CM | POA: Diagnosis not present

## 2023-05-10 ENCOUNTER — Other Ambulatory Visit (HOSPITAL_COMMUNITY): Payer: Self-pay

## 2023-05-10 MED ORDER — METHIMAZOLE 5 MG PO TABS
10.0000 mg | ORAL_TABLET | Freq: Every day | ORAL | 1 refills | Status: DC
  Filled 2023-05-12: qty 60, 30d supply, fill #0
  Filled 2023-06-13: qty 60, 30d supply, fill #1

## 2023-05-13 ENCOUNTER — Other Ambulatory Visit: Payer: Self-pay

## 2023-05-13 ENCOUNTER — Other Ambulatory Visit (HOSPITAL_COMMUNITY): Payer: Self-pay

## 2023-05-27 DIAGNOSIS — Z111 Encounter for screening for respiratory tuberculosis: Secondary | ICD-10-CM | POA: Diagnosis not present

## 2023-05-29 DIAGNOSIS — R7611 Nonspecific reaction to tuberculin skin test without active tuberculosis: Secondary | ICD-10-CM | POA: Diagnosis not present

## 2023-06-13 ENCOUNTER — Other Ambulatory Visit (HOSPITAL_COMMUNITY): Payer: Self-pay

## 2023-07-08 ENCOUNTER — Other Ambulatory Visit (HOSPITAL_COMMUNITY): Payer: Self-pay

## 2023-07-08 ENCOUNTER — Other Ambulatory Visit (HOSPITAL_BASED_OUTPATIENT_CLINIC_OR_DEPARTMENT_OTHER): Payer: Self-pay

## 2023-07-08 MED ORDER — METHIMAZOLE 5 MG PO TABS
10.0000 mg | ORAL_TABLET | Freq: Every day | ORAL | 6 refills | Status: DC
  Filled 2023-07-08 – 2023-07-09 (×2): qty 60, 30d supply, fill #0
  Filled 2023-08-07: qty 60, 30d supply, fill #1
  Filled 2023-09-30: qty 60, 30d supply, fill #2
  Filled 2023-11-06: qty 60, 30d supply, fill #3
  Filled 2023-12-05: qty 60, 30d supply, fill #4
  Filled 2024-02-12: qty 120, 60d supply, fill #5

## 2023-07-09 ENCOUNTER — Other Ambulatory Visit (HOSPITAL_COMMUNITY): Payer: Self-pay

## 2023-07-16 DIAGNOSIS — E0590 Thyrotoxicosis, unspecified without thyrotoxic crisis or storm: Secondary | ICD-10-CM | POA: Diagnosis not present

## 2023-07-19 ENCOUNTER — Other Ambulatory Visit: Payer: Self-pay

## 2023-07-23 ENCOUNTER — Other Ambulatory Visit: Payer: Self-pay | Admitting: Endocrinology

## 2023-07-23 DIAGNOSIS — E012 Iodine-deficiency related (endemic) goiter, unspecified: Secondary | ICD-10-CM

## 2023-08-07 ENCOUNTER — Other Ambulatory Visit (HOSPITAL_COMMUNITY): Payer: Self-pay

## 2023-09-05 ENCOUNTER — Ambulatory Visit
Admission: RE | Admit: 2023-09-05 | Discharge: 2023-09-05 | Disposition: A | Discharge status: Home / Self Care | Source: Ambulatory Visit | Attending: Endocrinology | Admitting: Endocrinology

## 2023-09-05 DIAGNOSIS — E012 Iodine-deficiency related (endemic) goiter, unspecified: Secondary | ICD-10-CM

## 2023-10-01 ENCOUNTER — Other Ambulatory Visit: Payer: Self-pay

## 2023-11-06 ENCOUNTER — Other Ambulatory Visit (HOSPITAL_COMMUNITY): Payer: Self-pay

## 2023-11-06 ENCOUNTER — Other Ambulatory Visit: Payer: Self-pay

## 2023-12-06 ENCOUNTER — Encounter: Payer: Self-pay | Admitting: Pharmacist

## 2023-12-06 ENCOUNTER — Other Ambulatory Visit (HOSPITAL_COMMUNITY): Payer: Self-pay

## 2023-12-06 ENCOUNTER — Other Ambulatory Visit: Payer: Self-pay

## 2023-12-09 ENCOUNTER — Other Ambulatory Visit: Payer: Self-pay | Admitting: Internal Medicine

## 2023-12-09 DIAGNOSIS — Z1231 Encounter for screening mammogram for malignant neoplasm of breast: Secondary | ICD-10-CM

## 2023-12-11 ENCOUNTER — Other Ambulatory Visit: Payer: Self-pay

## 2023-12-11 ENCOUNTER — Other Ambulatory Visit (HOSPITAL_COMMUNITY): Payer: Self-pay

## 2023-12-27 ENCOUNTER — Other Ambulatory Visit: Payer: Self-pay | Admitting: Internal Medicine

## 2023-12-27 ENCOUNTER — Ambulatory Visit
Admission: RE | Admit: 2023-12-27 | Discharge: 2023-12-27 | Disposition: A | Discharge status: Home / Self Care | Source: Ambulatory Visit | Attending: Internal Medicine | Admitting: Internal Medicine

## 2023-12-27 DIAGNOSIS — Z1231 Encounter for screening mammogram for malignant neoplasm of breast: Secondary | ICD-10-CM

## 2024-01-25 ENCOUNTER — Other Ambulatory Visit (HOSPITAL_COMMUNITY): Payer: Self-pay

## 2024-01-27 ENCOUNTER — Other Ambulatory Visit: Payer: Self-pay

## 2024-02-12 ENCOUNTER — Other Ambulatory Visit: Payer: Self-pay

## 2024-02-12 ENCOUNTER — Other Ambulatory Visit (HOSPITAL_COMMUNITY): Payer: Self-pay

## 2024-02-14 ENCOUNTER — Other Ambulatory Visit: Payer: Self-pay

## 2024-02-14 ENCOUNTER — Other Ambulatory Visit (HOSPITAL_COMMUNITY): Payer: Self-pay

## 2024-02-19 ENCOUNTER — Other Ambulatory Visit (HOSPITAL_COMMUNITY): Payer: Self-pay

## 2024-02-21 ENCOUNTER — Other Ambulatory Visit (HOSPITAL_COMMUNITY): Payer: Self-pay

## 2024-02-21 MED ORDER — METHIMAZOLE 5 MG PO TABS: ORAL_TABLET | ORAL | 5 refills | Status: AC

## 2024-03-06 ENCOUNTER — Other Ambulatory Visit (HOSPITAL_COMMUNITY): Payer: Self-pay

## 2024-03-06 ENCOUNTER — Other Ambulatory Visit: Payer: Self-pay

## 2024-03-06 MED ORDER — EPINEPHRINE 0.3 MG/0.3ML IJ SOAJ
INTRAMUSCULAR | 1 refills | Status: AC
  Filled 2024-03-06: qty 2, 30d supply, fill #0

## 2024-03-06 MED ORDER — PRAVASTATIN SODIUM 20 MG PO TABS
20.0000 mg | ORAL_TABLET | Freq: Every day | ORAL | 5 refills | Status: AC
  Filled 2024-03-06: qty 90, 90d supply, fill #0

## 2024-03-09 ENCOUNTER — Other Ambulatory Visit (HOSPITAL_COMMUNITY): Payer: Self-pay

## 2024-03-09 MED ORDER — PRAVASTATIN SODIUM 40 MG PO TABS
40.0000 mg | ORAL_TABLET | Freq: Every day | ORAL | 5 refills | Status: AC
  Filled 2024-03-09: qty 90, 90d supply, fill #0

## 2024-03-10 ENCOUNTER — Other Ambulatory Visit: Payer: Self-pay

## 2024-04-07 ENCOUNTER — Other Ambulatory Visit (HOSPITAL_COMMUNITY): Payer: Self-pay

## 2024-04-08 ENCOUNTER — Other Ambulatory Visit (HOSPITAL_COMMUNITY): Payer: Self-pay

## 2024-04-10 ENCOUNTER — Other Ambulatory Visit (HOSPITAL_BASED_OUTPATIENT_CLINIC_OR_DEPARTMENT_OTHER): Payer: Self-pay

## 2024-04-10 ENCOUNTER — Other Ambulatory Visit (HOSPITAL_COMMUNITY): Payer: Self-pay

## 2024-04-12 ENCOUNTER — Other Ambulatory Visit (HOSPITAL_COMMUNITY): Payer: Self-pay

## 2024-04-12 MED ORDER — METHIMAZOLE 5 MG PO TABS
ORAL_TABLET | ORAL | 5 refills | Status: AC
  Filled 2024-04-12: qty 44, 28d supply, fill #0
